# Patient Record
Sex: Female | Born: 1940 | Race: Black or African American | Hispanic: No | Marital: Single | Smoking: Never smoker
Health system: Southern US, Community
[De-identification: ages and names within clinical notes are randomized; demographics above are authoritative.]

## PROBLEM LIST (undated history)

## (undated) DIAGNOSIS — E559 Vitamin D deficiency, unspecified: Secondary | ICD-10-CM

## (undated) DIAGNOSIS — M810 Age-related osteoporosis without current pathological fracture: Secondary | ICD-10-CM

## (undated) DIAGNOSIS — E785 Hyperlipidemia, unspecified: Secondary | ICD-10-CM

## (undated) DIAGNOSIS — D352 Benign neoplasm of pituitary gland: Secondary | ICD-10-CM

## (undated) DIAGNOSIS — M1990 Unspecified osteoarthritis, unspecified site: Secondary | ICD-10-CM

## (undated) DIAGNOSIS — I10 Essential (primary) hypertension: Secondary | ICD-10-CM

## (undated) DIAGNOSIS — K219 Gastro-esophageal reflux disease without esophagitis: Secondary | ICD-10-CM

## (undated) DIAGNOSIS — F0390 Unspecified dementia without behavioral disturbance: Secondary | ICD-10-CM

## (undated) DIAGNOSIS — H9190 Unspecified hearing loss, unspecified ear: Secondary | ICD-10-CM

## (undated) DIAGNOSIS — R42 Dizziness and giddiness: Secondary | ICD-10-CM

## (undated) HISTORY — PX: EYE SURGERY: SHX253

## (undated) HISTORY — PX: ABDOMINAL HYSTERECTOMY: SHX81

---

## 2006-09-14 ENCOUNTER — Ambulatory Visit: Payer: Self-pay

## 2007-10-03 ENCOUNTER — Ambulatory Visit: Payer: Self-pay

## 2008-06-11 ENCOUNTER — Ambulatory Visit: Payer: Self-pay | Admitting: Otolaryngology

## 2008-11-27 ENCOUNTER — Ambulatory Visit: Payer: Self-pay | Admitting: Family Medicine

## 2010-01-05 ENCOUNTER — Ambulatory Visit: Payer: Self-pay | Admitting: Family Medicine

## 2010-09-10 ENCOUNTER — Ambulatory Visit: Payer: Self-pay | Admitting: Ophthalmology

## 2011-04-01 ENCOUNTER — Emergency Department: Payer: Self-pay | Admitting: Emergency Medicine

## 2011-08-18 ENCOUNTER — Ambulatory Visit: Payer: Self-pay

## 2011-11-29 ENCOUNTER — Ambulatory Visit: Payer: Self-pay | Admitting: Ophthalmology

## 2011-12-12 ENCOUNTER — Ambulatory Visit: Payer: Self-pay | Admitting: Ophthalmology

## 2013-02-05 ENCOUNTER — Ambulatory Visit: Payer: Self-pay | Admitting: Internal Medicine

## 2013-02-25 ENCOUNTER — Ambulatory Visit: Payer: Self-pay | Admitting: Internal Medicine

## 2014-03-27 ENCOUNTER — Ambulatory Visit: Payer: Self-pay | Admitting: Internal Medicine

## 2014-06-24 NOTE — Op Note (Signed)
PATIENT NAME:  Christy Green, Christy Green MR#:  [id number] DATE OF BIRTH:  30-Apr-1940  DATE OF PROCEDURE:  12/12/2011  PREOPERATIVE DIAGNOSIS:  Cataract, left eye.    POSTOPERATIVE DIAGNOSIS:  Cataract, left eye.  PROCEDURE PERFORMED:  Extracapsular cataract extraction using phacoemulsification with placement of an Alcon [id number], 19.0-diopter posterior chamber lens, serial [id number].  SURGEON:  Loura Back. Lanah Steines, MD  ASSISTANT:  None.  ANESTHESIA:  4% lidocaine and 0.75% Marcaine in a 50/50 mixture with 10 units/mL of Hylenex added, given as a peribulbar.  ANESTHESIOLOGIST:  Dennard Nip, MD  COMPLICATIONS:  None.  ESTIMATED BLOOD LOSS:  Less than 1 ml.  DESCRIPTION OF PROCEDURE:  The patient was brought to the operating room and given a peribulbar block.  The patient was then prepped and draped in the usual fashion.  The vertical rectus muscles were imbricated using 5-0 silk sutures.  These sutures were then clamped to the sterile drapes as bridle sutures.  A limbal peritomy was performed extending two clock hours and hemostasis was obtained with cautery.  A partial thickness scleral groove was made at the surgical limbus and dissected anteriorly in a lamellar dissection using an Alcon crescent knife.  The anterior chamber was entered supero-temporally with a Superblade and through the lamellar dissection with a 2.6 mm keratome.  DisCoVisc was used to replace the aqueous and a continuous tear capsulorrhexis was carried out.  Hydrodissection and hydrodelineation were carried out with balanced salt and a 27 gauge canula.  The nucleus was rotated to confirm the effectiveness of the hydrodissection.  Phacoemulsification was carried out using a divide-and-conquer technique.  Total ultrasound time was 2 minutes and 37 seconds with an average power of 25.5 percent with CDE of 67.67.  Irrigation/aspiration was used to remove the residual cortex.  DisCoVisc was used to inflate the capsule and the  internal incision was enlarged to 3 mm with the crescent knife.  The intraocular lens was folded and inserted into the capsular bag using the AcrySert delivery system. Irrigation/aspiration was used to remove the residual DisCoVisc.  Miostat was injected into the anterior chamber through the paracentesis track to inflate the anterior chamber and induce miosis.  The wound was checked for leaks and none were found. The conjunctiva was closed with cautery and the bridle sutures were removed.  Two drops of 0.3% Vigamox were placed on the eye.   An eye shield was placed on the eye.  The patient was discharged to the recovery room in good condition.  ____________________________ Loura Back Audie Stayer, MD sad:slb D: 12/12/2011 13:50:16 ET T: 12/12/2011 14:21:24 ET JOB#: [id number]  cc: Remo Lipps A. Gadiel John, MD, <Dictator> Martie Lee MD ELECTRONICALLY SIGNED 12/19/2011 14:11

## 2014-10-13 DIAGNOSIS — M1711 Unilateral primary osteoarthritis, right knee: Secondary | ICD-10-CM | POA: Diagnosis not present

## 2014-10-13 DIAGNOSIS — M25561 Pain in right knee: Secondary | ICD-10-CM | POA: Diagnosis not present

## 2014-11-25 DIAGNOSIS — K219 Gastro-esophageal reflux disease without esophagitis: Secondary | ICD-10-CM | POA: Diagnosis not present

## 2014-11-25 DIAGNOSIS — Z800 Family history of malignant neoplasm of digestive organs: Secondary | ICD-10-CM | POA: Diagnosis not present

## 2014-11-25 DIAGNOSIS — R1013 Epigastric pain: Secondary | ICD-10-CM | POA: Diagnosis not present

## 2014-12-03 DIAGNOSIS — Z961 Presence of intraocular lens: Secondary | ICD-10-CM | POA: Diagnosis not present

## 2014-12-10 DIAGNOSIS — M25461 Effusion, right knee: Secondary | ICD-10-CM | POA: Diagnosis not present

## 2014-12-10 DIAGNOSIS — M25561 Pain in right knee: Secondary | ICD-10-CM | POA: Diagnosis not present

## 2014-12-22 DIAGNOSIS — E782 Mixed hyperlipidemia: Secondary | ICD-10-CM | POA: Diagnosis not present

## 2014-12-22 DIAGNOSIS — K219 Gastro-esophageal reflux disease without esophagitis: Secondary | ICD-10-CM | POA: Diagnosis not present

## 2014-12-22 DIAGNOSIS — I10 Essential (primary) hypertension: Secondary | ICD-10-CM | POA: Diagnosis not present

## 2014-12-22 DIAGNOSIS — G4762 Sleep related leg cramps: Secondary | ICD-10-CM | POA: Diagnosis not present

## 2014-12-22 DIAGNOSIS — Z23 Encounter for immunization: Secondary | ICD-10-CM | POA: Diagnosis not present

## 2015-01-05 ENCOUNTER — Encounter: Payer: Self-pay | Admitting: *Deleted

## 2015-01-06 ENCOUNTER — Encounter: Admission: RE | Payer: Self-pay | Source: Ambulatory Visit

## 2015-01-06 ENCOUNTER — Ambulatory Visit
Admission: RE | Admit: 2015-01-06 | Payer: Commercial Managed Care - HMO | Source: Ambulatory Visit | Admitting: Gastroenterology

## 2015-01-06 HISTORY — DX: Vitamin D deficiency, unspecified: E55.9

## 2015-01-06 HISTORY — DX: Gastro-esophageal reflux disease without esophagitis: K21.9

## 2015-01-06 HISTORY — DX: Essential (primary) hypertension: I10

## 2015-01-06 HISTORY — DX: Hyperlipidemia, unspecified: E78.5

## 2015-01-06 HISTORY — DX: Age-related osteoporosis without current pathological fracture: M81.0

## 2015-01-06 SURGERY — COLONOSCOPY WITH PROPOFOL
Anesthesia: General

## 2015-02-23 ENCOUNTER — Encounter: Payer: Self-pay | Admitting: *Deleted

## 2015-02-24 ENCOUNTER — Ambulatory Visit: Payer: Commercial Managed Care - HMO | Admitting: Anesthesiology

## 2015-02-24 ENCOUNTER — Encounter: Payer: Self-pay | Admitting: *Deleted

## 2015-02-24 ENCOUNTER — Ambulatory Visit
Admission: RE | Admit: 2015-02-24 | Discharge: 2015-02-24 | Disposition: A | Discharge status: Home / Self Care | Payer: Commercial Managed Care - HMO | Source: Ambulatory Visit | Attending: Gastroenterology | Admitting: Gastroenterology

## 2015-02-24 ENCOUNTER — Encounter
Admission: RE | Disposition: A | Discharge status: Home / Self Care | Payer: Self-pay | Source: Ambulatory Visit | Attending: Gastroenterology

## 2015-02-24 DIAGNOSIS — Z1211 Encounter for screening for malignant neoplasm of colon: Secondary | ICD-10-CM | POA: Diagnosis not present

## 2015-02-24 DIAGNOSIS — Q438 Other specified congenital malformations of intestine: Secondary | ICD-10-CM | POA: Insufficient documentation

## 2015-02-24 DIAGNOSIS — K2960 Other gastritis without bleeding: Secondary | ICD-10-CM | POA: Diagnosis not present

## 2015-02-24 DIAGNOSIS — K644 Residual hemorrhoidal skin tags: Secondary | ICD-10-CM | POA: Diagnosis not present

## 2015-02-24 DIAGNOSIS — Z7982 Long term (current) use of aspirin: Secondary | ICD-10-CM | POA: Diagnosis not present

## 2015-02-24 DIAGNOSIS — K210 Gastro-esophageal reflux disease with esophagitis: Secondary | ICD-10-CM | POA: Diagnosis not present

## 2015-02-24 DIAGNOSIS — E559 Vitamin D deficiency, unspecified: Secondary | ICD-10-CM | POA: Diagnosis not present

## 2015-02-24 DIAGNOSIS — K5790 Diverticulosis of intestine, part unspecified, without perforation or abscess without bleeding: Secondary | ICD-10-CM | POA: Diagnosis not present

## 2015-02-24 DIAGNOSIS — K648 Other hemorrhoids: Secondary | ICD-10-CM | POA: Insufficient documentation

## 2015-02-24 DIAGNOSIS — K219 Gastro-esophageal reflux disease without esophagitis: Secondary | ICD-10-CM | POA: Diagnosis not present

## 2015-02-24 DIAGNOSIS — K222 Esophageal obstruction: Secondary | ICD-10-CM | POA: Diagnosis not present

## 2015-02-24 DIAGNOSIS — K2970 Gastritis, unspecified, without bleeding: Secondary | ICD-10-CM | POA: Diagnosis not present

## 2015-02-24 DIAGNOSIS — K640 First degree hemorrhoids: Secondary | ICD-10-CM | POA: Diagnosis not present

## 2015-02-24 DIAGNOSIS — I10 Essential (primary) hypertension: Secondary | ICD-10-CM | POA: Diagnosis not present

## 2015-02-24 DIAGNOSIS — M1990 Unspecified osteoarthritis, unspecified site: Secondary | ICD-10-CM | POA: Diagnosis not present

## 2015-02-24 DIAGNOSIS — K2950 Unspecified chronic gastritis without bleeding: Secondary | ICD-10-CM | POA: Diagnosis not present

## 2015-02-24 DIAGNOSIS — K2980 Duodenitis without bleeding: Secondary | ICD-10-CM | POA: Diagnosis not present

## 2015-02-24 DIAGNOSIS — Z79899 Other long term (current) drug therapy: Secondary | ICD-10-CM | POA: Insufficient documentation

## 2015-02-24 DIAGNOSIS — K449 Diaphragmatic hernia without obstruction or gangrene: Secondary | ICD-10-CM | POA: Insufficient documentation

## 2015-02-24 DIAGNOSIS — K5730 Diverticulosis of large intestine without perforation or abscess without bleeding: Secondary | ICD-10-CM | POA: Diagnosis not present

## 2015-02-24 DIAGNOSIS — Z538 Procedure and treatment not carried out for other reasons: Secondary | ICD-10-CM | POA: Diagnosis not present

## 2015-02-24 DIAGNOSIS — Z800 Family history of malignant neoplasm of digestive organs: Secondary | ICD-10-CM | POA: Diagnosis not present

## 2015-02-24 DIAGNOSIS — K2990 Gastroduodenitis, unspecified, without bleeding: Secondary | ICD-10-CM | POA: Diagnosis not present

## 2015-02-24 DIAGNOSIS — E785 Hyperlipidemia, unspecified: Secondary | ICD-10-CM | POA: Insufficient documentation

## 2015-02-24 HISTORY — PX: COLONOSCOPY WITH PROPOFOL: SHX5780

## 2015-02-24 HISTORY — DX: Unspecified osteoarthritis, unspecified site: M19.90

## 2015-02-24 HISTORY — PX: ESOPHAGOGASTRODUODENOSCOPY (EGD) WITH PROPOFOL: SHX5813

## 2015-02-24 SURGERY — ESOPHAGOGASTRODUODENOSCOPY (EGD) WITH PROPOFOL
Anesthesia: General

## 2015-02-24 MED ORDER — SODIUM CHLORIDE 0.9 % IV SOLN
INTRAVENOUS | Status: DC
  Administered 2015-02-24: 1000 mL via INTRAVENOUS

## 2015-02-24 MED ORDER — SODIUM CHLORIDE 0.9 % IV SOLN
INTRAVENOUS | Status: DC | PRN
  Administered 2015-02-24: 09:00:00 via INTRAVENOUS

## 2015-02-24 MED ORDER — PROPOFOL 500 MG/50ML IV EMUL
INTRAVENOUS | Status: DC | PRN
  Administered 2015-02-24: 120 ug/kg/min via INTRAVENOUS

## 2015-02-24 MED ORDER — SODIUM CHLORIDE 0.9 % IV SOLN: INTRAVENOUS | Status: DC

## 2015-02-24 MED ORDER — PROPOFOL 10 MG/ML IV BOLUS
INTRAVENOUS | Status: DC | PRN
  Administered 2015-02-24: 50 mg via INTRAVENOUS

## 2015-02-24 NOTE — Transfer of Care (Signed)
Immediate Anesthesia Transfer of Care Note  Patient: Christy Green  Procedure(s) Performed: Procedure(s): ESOPHAGOGASTRODUODENOSCOPY (EGD) WITH PROPOFOL (N/A) COLONOSCOPY WITH PROPOFOL (N/A)  Patient Location: PACU  Anesthesia Type:General  Level of Consciousness: awake, alert  and oriented  Airway & Oxygen Therapy: Patient Spontanous Breathing and Patient connected to nasal cannula oxygen  Post-op Assessment: Report given to RN and Post -op Vital signs reviewed and stable  Post vital signs: Reviewed and stable  Last Vitals:  Filed Vitals:   02/24/15 0822  BP: 158/62  Pulse: 71  Temp: 36.8 C  Resp: 16    Complications: No apparent anesthesia complications

## 2015-02-24 NOTE — Anesthesia Postprocedure Evaluation (Signed)
Anesthesia Post Note  Patient: Christy Green  Procedure(s) Performed: Procedure(s) (LRB): ESOPHAGOGASTRODUODENOSCOPY (EGD) WITH PROPOFOL (N/A) COLONOSCOPY WITH PROPOFOL (N/A)  Patient location during evaluation: Endoscopy Anesthesia Type: General Level of consciousness: awake Pain management: pain level controlled Vital Signs Assessment: post-procedure vital signs reviewed and stable Respiratory status: spontaneous breathing Cardiovascular status: blood pressure returned to baseline Anesthetic complications: no    Last Vitals:  Filed Vitals:   02/24/15 1020 02/24/15 1030  BP: 123/66 123/65  Pulse:    Temp:    Resp:      Last Pain: There were no vitals filed for this visit.               Tabithia Stroder S

## 2015-02-24 NOTE — H&P (Signed)
Outpatient short stay form Pre-procedure 02/24/2015 8:46 AM Lollie Sails MD  Primary Physician: Glendon Axe, MD  Reason for visit:  EGD and colonoscopy  History of present illness:  Patient is a 74 year old female presenting today for a colonoscopy in regards to her family history of colon cancer and a primary relative as well as some problems with increasing reflux symptoms. She has been placed on a proton pump inhibitor however this has not ameliorated the problem. In discussion with her she is not taking that the correct intervals and this may be of some benefit to adjust.  He tolerated her prep well. She does take an 81 mg aspirin but hasn't for maybe 2 days. She also takes etodolac 400 mg twice a day.    Current facility-administered medications:  .  0.9 %  sodium chloride infusion, , Intravenous, Continuous, Lollie Sails, MD, Last Rate: 20 mL/hr at 02/24/15 0844, 1,000 mL at 02/24/15 0844 .  0.9 %  sodium chloride infusion, , Intravenous, Continuous, Lollie Sails, MD  Prescriptions prior to admission  Medication Sig Dispense Refill Last Dose  . amLODipine (NORVASC) 5 MG tablet Take 5 mg by mouth daily.     Marland Kitchen aspirin EC 81 MG tablet Take 81 mg by mouth daily.     Marland Kitchen etodolac (LODINE) 400 MG tablet Take 400 mg by mouth 2 (two) times daily.     . Multiple Vitamin (MULTIVITAMIN IRON-FREE PO) Take by mouth.     . Omega 3-6-9 Fatty Acids (OMEGA-3 & OMEGA-6 FISH OIL PO) Take by mouth.     . pantoprazole (PROTONIX) 40 MG tablet Take 40 mg by mouth daily.     . pediatric multivitamin + iron (POLY-VI-SOL +IRON) 10 MG/ML oral solution Take by mouth daily.     . pravastatin (PRAVACHOL) 10 MG tablet Take 10 mg by mouth daily.     . prednisoLONE acetate (PRED FORTE) 1 % ophthalmic suspension 1 drop 4 (four) times daily.     . sucralfate (CARAFATE) 1 G tablet Take 1 g by mouth 4 (four) times daily -  with meals and at bedtime.        No Known Allergies   Past Medical  History  Diagnosis Date  . GERD (gastroesophageal reflux disease)   . Hyperlipidemia   . Hypertension   . Osteoporosis   . Vitamin D deficiency   . Arthritis     Review of systems:      Physical Exam    Heart and lungs: Regular rate and rhythm without rub or gallop, lungs are bilaterally clear.    HEENT: Normocephalic atraumatic eyes are anicteric    Other:     Pertinant exam for procedure: Soft nontender nondistended bowel sounds positive normoactive.    Planned proceedures: Egd, colonoscopy and indicated procedures. I have discussed the risks benefits and complications of procedures to include not limited to bleeding, infection, perforation and the risk of sedation and the patient wishes to proceed.    Lollie Sails, MD Gastroenterology 02/24/2015  8:46 AM

## 2015-02-24 NOTE — Op Note (Signed)
[facility] Gastroenterology Patient Name: Christy Green Procedure Date: 02/24/2015 8:51 AM MRN: [id number] Account #: [id number] Date of Birth: [birth year] Admit Type: Outpatient Age: 74 Room: [facility] ENDO ROOM 3 Gender: Female Note Status: Finalized Procedure:         Upper GI endoscopy Providers:         Lollie Sails, MD Referring MD:      Glendon Axe (Referring MD) Medicines:         Monitored Anesthesia Care Complications:     No immediate complications. Procedure:         Pre-Anesthesia Assessment:                    - ASA Grade Assessment: III - A patient with severe                     systemic disease.                    After obtaining informed consent, the endoscope was passed                     under direct vision. Throughout the procedure, the                     patient's blood pressure, pulse, and oxygen saturations                     were monitored continuously. The Endoscope was introduced                     through the mouth, and advanced to the third part of                     duodenum. The upper GI endoscopy was accomplished without                     difficulty. The patient tolerated the procedure well. Findings:      A small hiatus hernia was found. The Z-line was a variable distance from       incisors; the hiatal hernia was sliding.      A widely patent and non-obstructing Schatzki ring (acquired) was found       at the gastroesophageal junction. Biopsies were taken with a cold       forceps for histology.      Patchy minimal inflammation characterized by congestion (edema) and       erythema was found in the prepyloric region of the stomach. Biopsies       were taken with a cold forceps for histology.      Patchy mild inflammation characterized by congestion (edema) and       erythema was found in the duodenal bulb.      The cardia and gastric fundus were normal on retroflexion. Impression:        - Small hiatus hernia.                  - Widely patent and non-obstructing Schatzki ring.                     Biopsied.                    - Gastritis. Biopsied.                    -  Duodenitis. Recommendation:    - Return to GI office in 1 month.                    - Use Protonix (pantoprazole) 40 mg PO daily daily.                    - Await pathology results.                    - Use Protonix (pantoprazole) 40 mg PO BID for 4 weeks. Procedure Code(s): --- Professional ---                    [phone], Esophagogastroduodenoscopy, flexible, transoral;                     with biopsy, single or multiple Diagnosis Code(s): --- Professional ---                    K44.9, Diaphragmatic hernia without obstruction or gangrene                    K22.2, Esophageal obstruction                    K29.70, Gastritis, unspecified, without bleeding                    K29.80, Duodenitis without bleeding CPT copyright 2014 American Medical Association. All rights reserved. The codes documented in this report are preliminary and upon coder review may  be revised to meet current compliance requirements. Lollie Sails, MD 02/24/2015 9:12:34 AM This report has been signed electronically. Number of Addenda: 0 Note Initiated On: 02/24/2015 8:51 AM      [facility]

## 2015-02-24 NOTE — Anesthesia Preprocedure Evaluation (Signed)
Anesthesia Evaluation  Patient identified by MRN, date of birth, ID band Patient awake    Reviewed: Allergy & Precautions, NPO status , Patient's Chart, lab work & pertinent test results, reviewed documented beta blocker date and time   Airway Mallampati: II  TM Distance: >3 FB     Dental  (+) Chipped   Pulmonary           Cardiovascular hypertension, Pt. on medications      Neuro/Psych    GI/Hepatic GERD  ,  Endo/Other    Renal/GU      Musculoskeletal  (+) Arthritis ,   Abdominal   Peds  Hematology   Anesthesia Other Findings   Reproductive/Obstetrics                             Anesthesia Physical Anesthesia Plan  ASA: III  Anesthesia Plan: General   Post-op Pain Management:    Induction: Intravenous  Airway Management Planned: Nasal Cannula  Additional Equipment:   Intra-op Plan:   Post-operative Plan:   Informed Consent: I have reviewed the patients History and Physical, chart, labs and discussed the procedure including the risks, benefits and alternatives for the proposed anesthesia with the patient or authorized representative who has indicated his/her understanding and acceptance.     Plan Discussed with: CRNA  Anesthesia Plan Comments:         Anesthesia Quick Evaluation  

## 2015-02-24 NOTE — Op Note (Signed)
[facility] Gastroenterology Patient Name: Christy Green Procedure Date: 02/24/2015 8:51 AM MRN: [id number] Account #: [id number] Date of Birth: Sep 10, 1940 Admit Type: Outpatient Age: 74 Room: [facility] ENDO ROOM 3 Gender: Female Note Status: Finalized Procedure:         Colonoscopy Indications:       Family history of colon cancer in a first-degree relative Providers:         Lollie Sails, MD Referring MD:      Glendon Axe (Referring MD) Medicines:         Monitored Anesthesia Care Complications:     No immediate complications. Procedure:         Pre-Anesthesia Assessment:                    - ASA Grade Assessment: III - A patient with severe                     systemic disease.                    After obtaining informed consent, the colonoscope was                     passed under direct vision. Throughout the procedure, the                     patient's blood pressure, pulse, and oxygen saturations                     were monitored continuously. The Colonoscope was                     introduced through the anus with the intention of                     advancing to the cecum. The scope was advanced to the                     transverse colon before the procedure was aborted.                     Medications were given. The colonoscopy was extremely                     difficult due to restricted mobility of the colon,                     significant looping and a tortuous colon. The patient                     tolerated the procedure well. The quality of the bowel                     preparation was good. Findings:      Many small and large-mouthed diverticula were found in the sigmoid       colon, in the descending colon and in the transverse colon.      The sigmoid colon and descending colon were significantly redundant.      Non-bleeding internal hemorrhoids were found during retroflexion and       during anoscopy. The hemorrhoids were small.    No additional abnormalities were found on retroflexion.      The perianal exam findings include skin tags. Impression:        - Diverticulosis  in the sigmoid colon, in the descending                     colon and in the transverse colon.                    - Redundant colon.                    - Non-bleeding internal hemorrhoids.                    - Perianal skin tags found on perianal exam.                    - No specimens collected. Recommendation:    - Discharge patient to home.                    - Perform an air contrast barium enema at appointment to                     be scheduled. Procedure Code(s): --- Professional ---                    [phone], 51, Colonoscopy, flexible; diagnostic, including                     collection of specimen(s) by brushing or washing, when                     performed (separate procedure) Diagnosis Code(s): --- Professional ---                    K64.8, Other hemorrhoids                    K64.4, Residual hemorrhoidal skin tags                    Z80.0, Family history of malignant neoplasm of digestive                     organs                    K57.30, Diverticulosis of large intestine without                     perforation or abscess without bleeding                    Q43.8, Other specified congenital malformations of                     intestine CPT copyright 2014 American Medical Association. All rights reserved. The codes documented in this report are preliminary and upon coder review may  be revised to meet current compliance requirements. Lollie Sails, MD 02/24/2015 9:54:21 AM This report has been signed electronically. Number of Addenda: 0 Note Initiated On: 02/24/2015 8:51 AM Scope Withdrawal Time: 0 hours 2 minutes 55 seconds  Total Procedure Duration: 0 hours 34 minutes 45 seconds       [facility]

## 2015-02-26 ENCOUNTER — Encounter: Payer: Self-pay | Admitting: Gastroenterology

## 2015-02-26 LAB — SURGICAL PATHOLOGY

## 2015-03-11 DIAGNOSIS — I10 Essential (primary) hypertension: Secondary | ICD-10-CM | POA: Diagnosis not present

## 2015-03-17 DIAGNOSIS — Z1239 Encounter for other screening for malignant neoplasm of breast: Secondary | ICD-10-CM | POA: Diagnosis not present

## 2015-03-17 DIAGNOSIS — E7800 Pure hypercholesterolemia, unspecified: Secondary | ICD-10-CM | POA: Diagnosis not present

## 2015-03-17 DIAGNOSIS — M1711 Unilateral primary osteoarthritis, right knee: Secondary | ICD-10-CM | POA: Diagnosis not present

## 2015-03-17 DIAGNOSIS — K219 Gastro-esophageal reflux disease without esophagitis: Secondary | ICD-10-CM | POA: Diagnosis not present

## 2015-03-17 DIAGNOSIS — I10 Essential (primary) hypertension: Secondary | ICD-10-CM | POA: Diagnosis not present

## 2015-03-18 ENCOUNTER — Other Ambulatory Visit: Payer: Self-pay | Admitting: Internal Medicine

## 2015-03-18 DIAGNOSIS — Z1239 Encounter for other screening for malignant neoplasm of breast: Secondary | ICD-10-CM

## 2015-03-24 DIAGNOSIS — R490 Dysphonia: Secondary | ICD-10-CM | POA: Diagnosis not present

## 2015-03-24 DIAGNOSIS — Z800 Family history of malignant neoplasm of digestive organs: Secondary | ICD-10-CM | POA: Diagnosis not present

## 2015-03-24 DIAGNOSIS — K219 Gastro-esophageal reflux disease without esophagitis: Secondary | ICD-10-CM | POA: Diagnosis not present

## 2015-03-25 ENCOUNTER — Other Ambulatory Visit: Payer: Self-pay | Admitting: Gastroenterology

## 2015-03-25 DIAGNOSIS — Z800 Family history of malignant neoplasm of digestive organs: Secondary | ICD-10-CM

## 2015-03-30 ENCOUNTER — Ambulatory Visit
Admission: RE | Admit: 2015-03-30 | Discharge: 2015-03-30 | Disposition: A | Discharge status: Home / Self Care | Payer: Commercial Managed Care - HMO | Source: Ambulatory Visit | Attending: Internal Medicine | Admitting: Internal Medicine

## 2015-03-30 ENCOUNTER — Other Ambulatory Visit: Payer: Self-pay | Admitting: Internal Medicine

## 2015-03-30 DIAGNOSIS — Z1231 Encounter for screening mammogram for malignant neoplasm of breast: Secondary | ICD-10-CM | POA: Insufficient documentation

## 2015-03-30 DIAGNOSIS — Z1239 Encounter for other screening for malignant neoplasm of breast: Secondary | ICD-10-CM

## 2015-04-02 DIAGNOSIS — M1711 Unilateral primary osteoarthritis, right knee: Secondary | ICD-10-CM | POA: Diagnosis not present

## 2015-04-03 ENCOUNTER — Ambulatory Visit: Payer: Commercial Managed Care - HMO

## 2015-04-23 ENCOUNTER — Other Ambulatory Visit: Payer: Self-pay | Admitting: Gastroenterology

## 2015-04-23 DIAGNOSIS — Q438 Other specified congenital malformations of intestine: Secondary | ICD-10-CM

## 2015-04-23 DIAGNOSIS — Z800 Family history of malignant neoplasm of digestive organs: Secondary | ICD-10-CM

## 2015-05-08 ENCOUNTER — Ambulatory Visit
Admission: RE | Admit: 2015-05-08 | Discharge: 2015-05-08 | Disposition: A | Discharge status: Home / Self Care | Payer: Commercial Managed Care - HMO | Source: Ambulatory Visit | Attending: Gastroenterology | Admitting: Gastroenterology

## 2015-05-08 DIAGNOSIS — Z800 Family history of malignant neoplasm of digestive organs: Secondary | ICD-10-CM | POA: Diagnosis not present

## 2015-05-08 DIAGNOSIS — K5790 Diverticulosis of intestine, part unspecified, without perforation or abscess without bleeding: Secondary | ICD-10-CM | POA: Diagnosis not present

## 2015-05-08 DIAGNOSIS — K5730 Diverticulosis of large intestine without perforation or abscess without bleeding: Secondary | ICD-10-CM | POA: Diagnosis not present

## 2015-05-08 DIAGNOSIS — Q438 Other specified congenital malformations of intestine: Secondary | ICD-10-CM

## 2015-06-11 DIAGNOSIS — H2012 Chronic iridocyclitis, left eye: Secondary | ICD-10-CM | POA: Diagnosis not present

## 2015-06-16 DIAGNOSIS — E7800 Pure hypercholesterolemia, unspecified: Secondary | ICD-10-CM | POA: Diagnosis not present

## 2015-06-16 DIAGNOSIS — K219 Gastro-esophageal reflux disease without esophagitis: Secondary | ICD-10-CM | POA: Diagnosis not present

## 2015-06-16 DIAGNOSIS — I10 Essential (primary) hypertension: Secondary | ICD-10-CM | POA: Diagnosis not present

## 2015-06-16 DIAGNOSIS — E559 Vitamin D deficiency, unspecified: Secondary | ICD-10-CM | POA: Diagnosis not present

## 2015-07-13 DIAGNOSIS — M1711 Unilateral primary osteoarthritis, right knee: Secondary | ICD-10-CM | POA: Diagnosis not present

## 2015-08-24 DIAGNOSIS — J301 Allergic rhinitis due to pollen: Secondary | ICD-10-CM | POA: Diagnosis not present

## 2015-08-24 DIAGNOSIS — R490 Dysphonia: Secondary | ICD-10-CM | POA: Diagnosis not present

## 2015-08-24 DIAGNOSIS — K219 Gastro-esophageal reflux disease without esophagitis: Secondary | ICD-10-CM | POA: Diagnosis not present

## 2015-08-25 DIAGNOSIS — M7041 Prepatellar bursitis, right knee: Secondary | ICD-10-CM | POA: Diagnosis not present

## 2015-08-25 DIAGNOSIS — M25561 Pain in right knee: Secondary | ICD-10-CM | POA: Diagnosis not present

## 2015-08-25 DIAGNOSIS — Z111 Encounter for screening for respiratory tuberculosis: Secondary | ICD-10-CM | POA: Diagnosis not present

## 2015-09-09 DIAGNOSIS — M7041 Prepatellar bursitis, right knee: Secondary | ICD-10-CM | POA: Diagnosis not present

## 2015-09-16 DIAGNOSIS — I10 Essential (primary) hypertension: Secondary | ICD-10-CM | POA: Diagnosis not present

## 2015-09-16 DIAGNOSIS — E559 Vitamin D deficiency, unspecified: Secondary | ICD-10-CM | POA: Diagnosis not present

## 2015-09-16 DIAGNOSIS — E7800 Pure hypercholesterolemia, unspecified: Secondary | ICD-10-CM | POA: Diagnosis not present

## 2015-10-21 DIAGNOSIS — E7800 Pure hypercholesterolemia, unspecified: Secondary | ICD-10-CM | POA: Diagnosis not present

## 2015-10-21 DIAGNOSIS — G301 Alzheimer's disease with late onset: Secondary | ICD-10-CM | POA: Diagnosis not present

## 2015-10-21 DIAGNOSIS — I10 Essential (primary) hypertension: Secondary | ICD-10-CM | POA: Diagnosis not present

## 2015-10-21 DIAGNOSIS — E559 Vitamin D deficiency, unspecified: Secondary | ICD-10-CM | POA: Diagnosis not present

## 2015-10-21 DIAGNOSIS — F0280 Dementia in other diseases classified elsewhere without behavioral disturbance: Secondary | ICD-10-CM | POA: Diagnosis not present

## 2015-10-21 DIAGNOSIS — H918X3 Other specified hearing loss, bilateral: Secondary | ICD-10-CM | POA: Diagnosis not present

## 2015-11-17 DIAGNOSIS — H6123 Impacted cerumen, bilateral: Secondary | ICD-10-CM | POA: Diagnosis not present

## 2015-11-17 DIAGNOSIS — H903 Sensorineural hearing loss, bilateral: Secondary | ICD-10-CM | POA: Diagnosis not present

## 2015-12-08 DIAGNOSIS — H2511 Age-related nuclear cataract, right eye: Secondary | ICD-10-CM | POA: Diagnosis not present

## 2016-03-22 DIAGNOSIS — K219 Gastro-esophageal reflux disease without esophagitis: Secondary | ICD-10-CM | POA: Diagnosis not present

## 2016-03-28 ENCOUNTER — Other Ambulatory Visit: Payer: Self-pay | Admitting: Internal Medicine

## 2016-03-28 DIAGNOSIS — Z1231 Encounter for screening mammogram for malignant neoplasm of breast: Secondary | ICD-10-CM

## 2016-03-31 ENCOUNTER — Other Ambulatory Visit: Payer: Self-pay | Admitting: Gastroenterology

## 2016-03-31 DIAGNOSIS — Z1211 Encounter for screening for malignant neoplasm of colon: Secondary | ICD-10-CM

## 2016-03-31 DIAGNOSIS — Z800 Family history of malignant neoplasm of digestive organs: Secondary | ICD-10-CM

## 2016-04-12 ENCOUNTER — Ambulatory Visit
Admission: RE | Admit: 2016-04-12 | Discharge: 2016-04-12 | Disposition: A | Discharge status: Home / Self Care | Payer: Medicare HMO | Source: Ambulatory Visit | Attending: Gastroenterology | Admitting: Gastroenterology

## 2016-04-12 DIAGNOSIS — K5730 Diverticulosis of large intestine without perforation or abscess without bleeding: Secondary | ICD-10-CM | POA: Diagnosis not present

## 2016-04-12 DIAGNOSIS — K8020 Calculus of gallbladder without cholecystitis without obstruction: Secondary | ICD-10-CM | POA: Diagnosis not present

## 2016-04-12 DIAGNOSIS — Z1211 Encounter for screening for malignant neoplasm of colon: Secondary | ICD-10-CM

## 2016-04-12 DIAGNOSIS — Z800 Family history of malignant neoplasm of digestive organs: Secondary | ICD-10-CM

## 2016-04-20 ENCOUNTER — Ambulatory Visit
Admission: RE | Admit: 2016-04-20 | Discharge: 2016-04-20 | Disposition: A | Discharge status: Home / Self Care | Payer: Commercial Managed Care - HMO | Source: Ambulatory Visit | Attending: Internal Medicine | Admitting: Internal Medicine

## 2016-04-20 DIAGNOSIS — Z1231 Encounter for screening mammogram for malignant neoplasm of breast: Secondary | ICD-10-CM | POA: Diagnosis not present

## 2016-06-06 DIAGNOSIS — H2511 Age-related nuclear cataract, right eye: Secondary | ICD-10-CM | POA: Diagnosis not present

## 2016-06-11 IMAGING — CR DG BE W/ AIR HIGH DENSITY
2 series · 10 of 10 positions shown · non-contrast
Comparison: None.

CLINICAL DATA: Incomplete colonoscopy.

EXAM:
AIR CONTRAST BARIUM ENEMA
TECHNIQUE: Initial scout AP supine abdominal image obtained to insure adequate
colon cleansing. Barium was introduced into the colon in a
retrograde fashion and refluxed from the rectum to the distal
transverse colon. As much of the barium as possible was then removed
through the indwelling tube via gravity drain. Air was then
insufflated into the colon. Spot images of the colon followed by
overhead radiographs were obtained.
FLUOROSCOPY TIME:  Radiation Exposure Index (as provided by the
fluoroscopic device): 109.4 mGy.

[Series 1: x rectum lateral · 0.14mm/px · 5 of 11 slices shown]
[im 1/11]
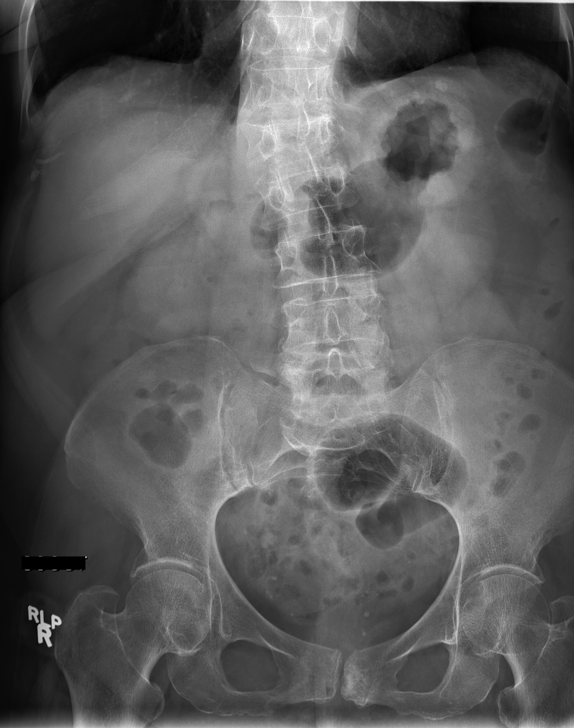
[im 3/11]
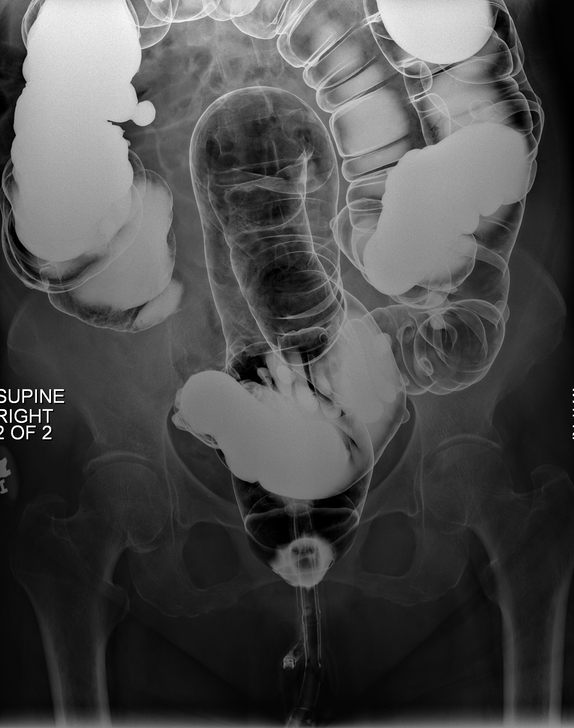
[im 6/11]
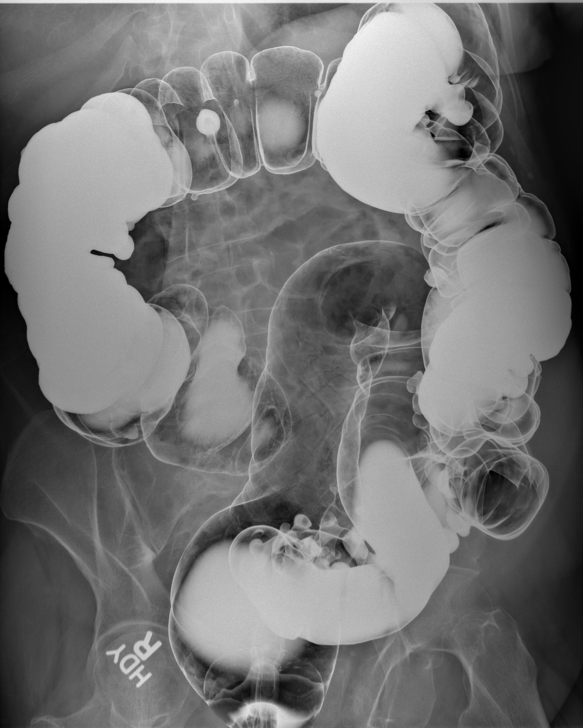
[im 8/11]
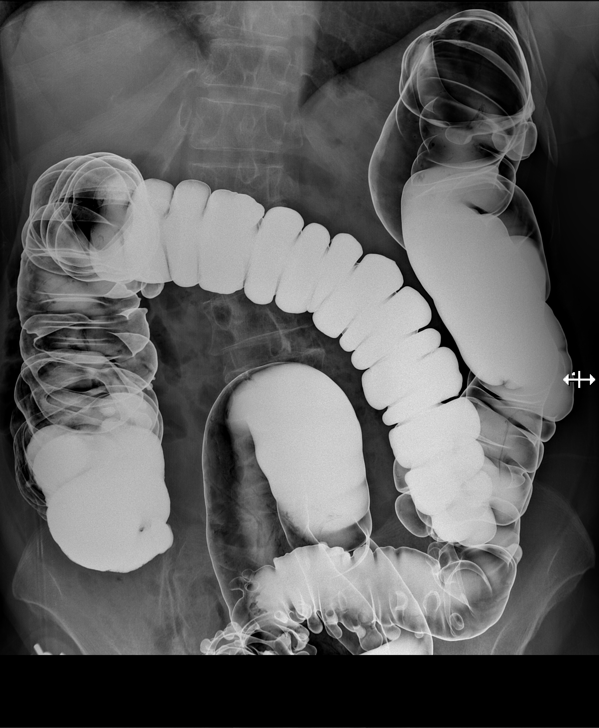
[im 11/11]
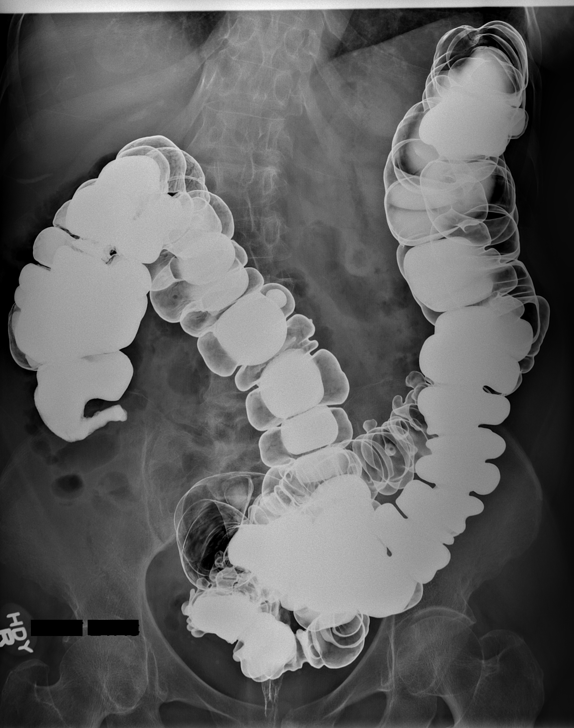

[Series 2: fluoro_barium singleshot_bb · 0.18mm/px · 5 of 10 slices shown]
[im 1/10]
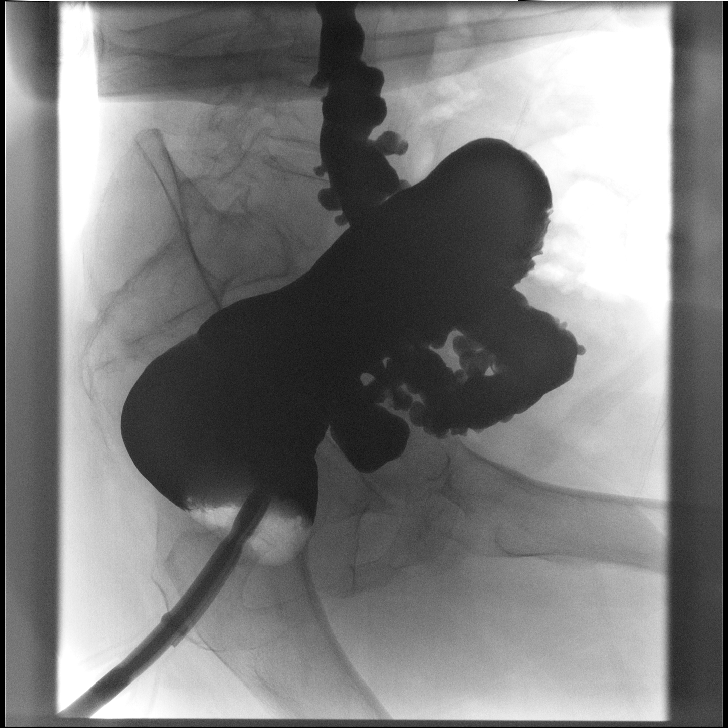
[im 3/10]
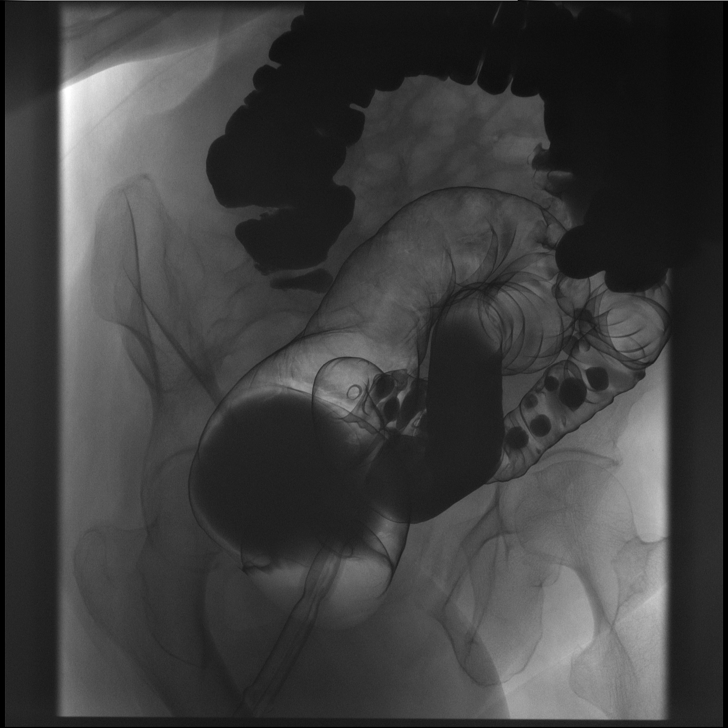
[im 5/10]
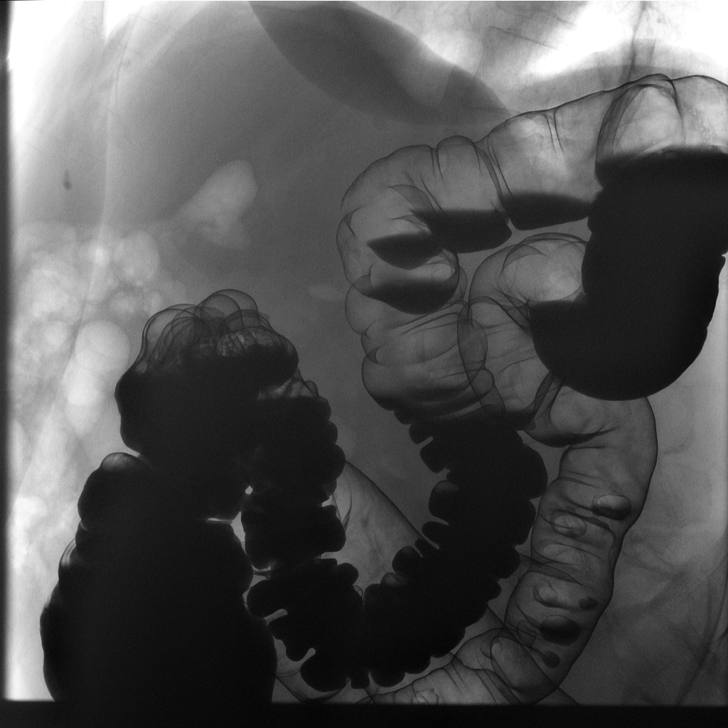
[im 7/10]
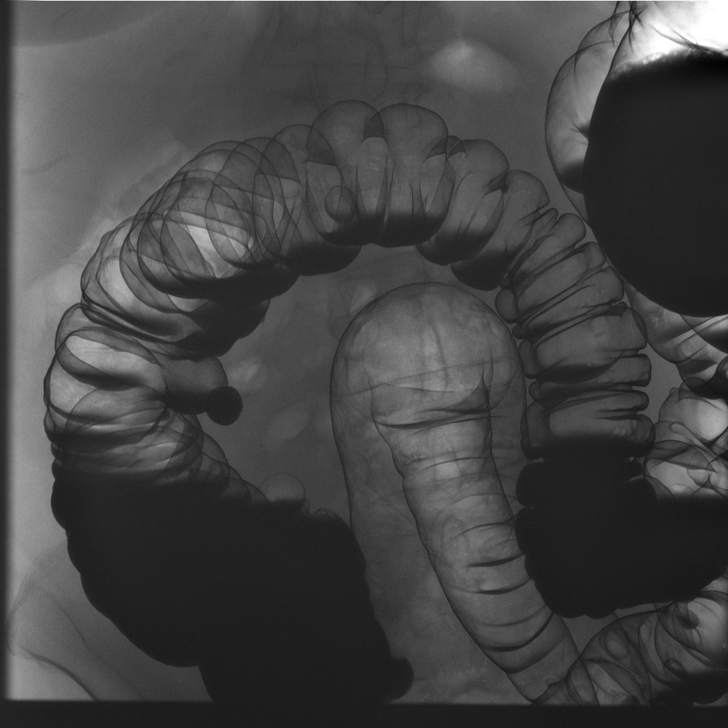
[im 10/10]
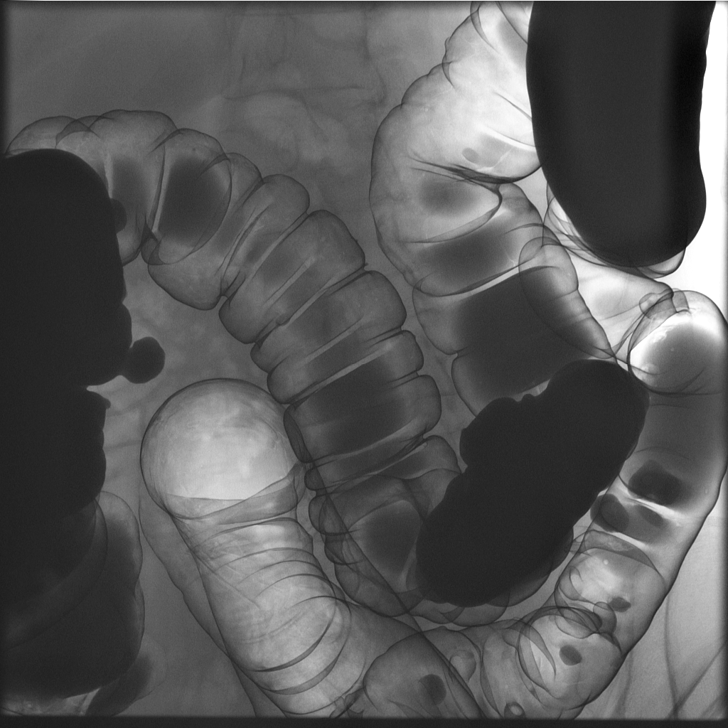

[10 of 10 positions shown; findings below may reference images not displayed]

FINDINGS: Scattered diverticuli noted throughout the entire colon. No focal
mass lesion or obstructing lesion identified. The terminal ileum
could not be visualized. What appears to be the appendix is normal.
IMPRESSION: Diffuse diverticulosis.  No focal lesion identified.

## 2016-07-21 DIAGNOSIS — E7800 Pure hypercholesterolemia, unspecified: Secondary | ICD-10-CM | POA: Diagnosis not present

## 2016-07-21 DIAGNOSIS — R42 Dizziness and giddiness: Secondary | ICD-10-CM | POA: Diagnosis not present

## 2016-07-21 DIAGNOSIS — Z131 Encounter for screening for diabetes mellitus: Secondary | ICD-10-CM | POA: Diagnosis not present

## 2016-07-21 DIAGNOSIS — I10 Essential (primary) hypertension: Secondary | ICD-10-CM | POA: Diagnosis not present

## 2016-07-21 DIAGNOSIS — E559 Vitamin D deficiency, unspecified: Secondary | ICD-10-CM | POA: Diagnosis not present

## 2016-07-21 DIAGNOSIS — F0280 Dementia in other diseases classified elsewhere without behavioral disturbance: Secondary | ICD-10-CM | POA: Diagnosis not present

## 2016-07-21 DIAGNOSIS — G301 Alzheimer's disease with late onset: Secondary | ICD-10-CM | POA: Diagnosis not present

## 2016-08-18 DIAGNOSIS — I6523 Occlusion and stenosis of bilateral carotid arteries: Secondary | ICD-10-CM | POA: Diagnosis not present

## 2016-08-18 DIAGNOSIS — R42 Dizziness and giddiness: Secondary | ICD-10-CM | POA: Diagnosis not present

## 2016-09-30 DIAGNOSIS — F0280 Dementia in other diseases classified elsewhere without behavioral disturbance: Secondary | ICD-10-CM | POA: Diagnosis not present

## 2016-09-30 DIAGNOSIS — Z23 Encounter for immunization: Secondary | ICD-10-CM | POA: Diagnosis not present

## 2016-09-30 DIAGNOSIS — E7800 Pure hypercholesterolemia, unspecified: Secondary | ICD-10-CM | POA: Diagnosis not present

## 2016-09-30 DIAGNOSIS — I10 Essential (primary) hypertension: Secondary | ICD-10-CM | POA: Diagnosis not present

## 2016-09-30 DIAGNOSIS — G301 Alzheimer's disease with late onset: Secondary | ICD-10-CM | POA: Diagnosis not present

## 2016-12-05 DIAGNOSIS — H2012 Chronic iridocyclitis, left eye: Secondary | ICD-10-CM | POA: Diagnosis not present

## 2017-03-15 DIAGNOSIS — E7800 Pure hypercholesterolemia, unspecified: Secondary | ICD-10-CM | POA: Diagnosis not present

## 2017-03-15 DIAGNOSIS — G301 Alzheimer's disease with late onset: Secondary | ICD-10-CM | POA: Diagnosis not present

## 2017-03-15 DIAGNOSIS — Z1231 Encounter for screening mammogram for malignant neoplasm of breast: Secondary | ICD-10-CM | POA: Diagnosis not present

## 2017-03-15 DIAGNOSIS — M7581 Other shoulder lesions, right shoulder: Secondary | ICD-10-CM | POA: Diagnosis not present

## 2017-03-15 DIAGNOSIS — F0280 Dementia in other diseases classified elsewhere without behavioral disturbance: Secondary | ICD-10-CM | POA: Diagnosis not present

## 2017-03-15 DIAGNOSIS — I10 Essential (primary) hypertension: Secondary | ICD-10-CM | POA: Diagnosis not present

## 2017-03-16 ENCOUNTER — Other Ambulatory Visit: Payer: Self-pay | Admitting: Internal Medicine

## 2017-03-16 DIAGNOSIS — Z1239 Encounter for other screening for malignant neoplasm of breast: Secondary | ICD-10-CM

## 2017-03-24 DIAGNOSIS — K5900 Constipation, unspecified: Secondary | ICD-10-CM | POA: Diagnosis not present

## 2017-03-24 DIAGNOSIS — R140 Abdominal distension (gaseous): Secondary | ICD-10-CM | POA: Diagnosis not present

## 2017-03-24 DIAGNOSIS — R1084 Generalized abdominal pain: Secondary | ICD-10-CM | POA: Diagnosis not present

## 2017-05-01 ENCOUNTER — Ambulatory Visit
Admission: RE | Admit: 2017-05-01 | Discharge: 2017-05-01 | Disposition: A | Discharge status: Home / Self Care | Payer: Medicare HMO | Source: Ambulatory Visit | Attending: Internal Medicine | Admitting: Internal Medicine

## 2017-05-01 DIAGNOSIS — Z1231 Encounter for screening mammogram for malignant neoplasm of breast: Secondary | ICD-10-CM | POA: Diagnosis not present

## 2017-05-01 DIAGNOSIS — Z1239 Encounter for other screening for malignant neoplasm of breast: Secondary | ICD-10-CM

## 2017-06-02 DIAGNOSIS — I10 Essential (primary) hypertension: Secondary | ICD-10-CM | POA: Diagnosis not present

## 2017-06-02 DIAGNOSIS — K219 Gastro-esophageal reflux disease without esophagitis: Secondary | ICD-10-CM | POA: Diagnosis not present

## 2017-06-02 DIAGNOSIS — K5909 Other constipation: Secondary | ICD-10-CM | POA: Diagnosis not present

## 2017-06-02 DIAGNOSIS — E7800 Pure hypercholesterolemia, unspecified: Secondary | ICD-10-CM | POA: Diagnosis not present

## 2017-06-05 DIAGNOSIS — Z961 Presence of intraocular lens: Secondary | ICD-10-CM | POA: Diagnosis not present

## 2017-06-29 DIAGNOSIS — R1310 Dysphagia, unspecified: Secondary | ICD-10-CM | POA: Diagnosis not present

## 2017-06-29 DIAGNOSIS — K219 Gastro-esophageal reflux disease without esophagitis: Secondary | ICD-10-CM | POA: Diagnosis not present

## 2017-07-26 DIAGNOSIS — I10 Essential (primary) hypertension: Secondary | ICD-10-CM | POA: Diagnosis not present

## 2017-07-26 DIAGNOSIS — K5909 Other constipation: Secondary | ICD-10-CM | POA: Diagnosis not present

## 2017-07-26 DIAGNOSIS — K219 Gastro-esophageal reflux disease without esophagitis: Secondary | ICD-10-CM | POA: Diagnosis not present

## 2017-08-22 DIAGNOSIS — K219 Gastro-esophageal reflux disease without esophagitis: Secondary | ICD-10-CM | POA: Diagnosis not present

## 2017-08-22 DIAGNOSIS — G301 Alzheimer's disease with late onset: Secondary | ICD-10-CM | POA: Diagnosis not present

## 2017-08-22 DIAGNOSIS — F0280 Dementia in other diseases classified elsewhere without behavioral disturbance: Secondary | ICD-10-CM | POA: Diagnosis not present

## 2017-09-13 DIAGNOSIS — I10 Essential (primary) hypertension: Secondary | ICD-10-CM | POA: Diagnosis not present

## 2017-09-13 DIAGNOSIS — R42 Dizziness and giddiness: Secondary | ICD-10-CM | POA: Diagnosis not present

## 2017-09-17 ENCOUNTER — Emergency Department: Payer: Medicare HMO

## 2017-09-17 ENCOUNTER — Other Ambulatory Visit: Payer: Self-pay

## 2017-09-17 ENCOUNTER — Emergency Department
Admission: EM | Admit: 2017-09-17 | Discharge: 2017-09-17 | Disposition: A | Discharge status: Home / Self Care | Payer: Medicare HMO | Attending: Emergency Medicine | Admitting: Emergency Medicine

## 2017-09-17 DIAGNOSIS — I10 Essential (primary) hypertension: Secondary | ICD-10-CM | POA: Insufficient documentation

## 2017-09-17 DIAGNOSIS — D352 Benign neoplasm of pituitary gland: Secondary | ICD-10-CM | POA: Insufficient documentation

## 2017-09-17 DIAGNOSIS — Z79899 Other long term (current) drug therapy: Secondary | ICD-10-CM | POA: Diagnosis not present

## 2017-09-17 DIAGNOSIS — Z7982 Long term (current) use of aspirin: Secondary | ICD-10-CM | POA: Diagnosis not present

## 2017-09-17 DIAGNOSIS — E860 Dehydration: Secondary | ICD-10-CM | POA: Diagnosis not present

## 2017-09-17 DIAGNOSIS — R42 Dizziness and giddiness: Secondary | ICD-10-CM | POA: Diagnosis not present

## 2017-09-17 LAB — CBC
HCT: 34.9 % — ABNORMAL LOW (ref 35.0–47.0)
Hemoglobin: 12.3 g/dL (ref 12.0–16.0)
MCH: 32.4 pg (ref 26.0–34.0)
MCHC: 35.4 g/dL (ref 32.0–36.0)
MCV: 91.4 fL (ref 80.0–100.0)
PLATELETS: 225 10*3/uL (ref 150–440)
RBC: 3.82 MIL/uL (ref 3.80–5.20)
RDW: 13.4 % (ref 11.5–14.5)
WBC: 5.6 10*3/uL (ref 3.6–11.0)

## 2017-09-17 LAB — BASIC METABOLIC PANEL
Anion gap: 6 (ref 5–15)
BUN: 19 mg/dL (ref 8–23)
CALCIUM: 9.1 mg/dL (ref 8.9–10.3)
CHLORIDE: 111 mmol/L (ref 98–111)
CO2: 24 mmol/L (ref 22–32)
Creatinine, Ser: 1.13 mg/dL — ABNORMAL HIGH (ref 0.44–1.00)
GFR calc Af Amer: 53 mL/min — ABNORMAL LOW (ref >60)
GFR calc non Af Amer: 45 mL/min — ABNORMAL LOW (ref >60)
GLUCOSE: 110 mg/dL — AB (ref 70–99)
Potassium: 4.1 mmol/L (ref 3.5–5.1)
Sodium: 141 mmol/L (ref 135–145)

## 2017-09-17 LAB — URINALYSIS, COMPLETE (UACMP) WITH MICROSCOPIC
Bacteria, UA: NONE SEEN
Bilirubin Urine: NEGATIVE
GLUCOSE, UA: NEGATIVE mg/dL
HGB URINE DIPSTICK: NEGATIVE
Ketones, ur: NEGATIVE mg/dL
Leukocytes, UA: NEGATIVE
Nitrite: NEGATIVE
Protein, ur: NEGATIVE mg/dL
SPECIFIC GRAVITY, URINE: 1.015 (ref 1.005–1.030)
pH: 6 (ref 5.0–8.0)

## 2017-09-17 MED ORDER — SODIUM CHLORIDE 0.9 % IV BOLUS
500.0000 mL | Freq: Once | INTRAVENOUS | Status: AC
  Administered 2017-09-17: 500 mL via INTRAVENOUS

## 2017-09-17 NOTE — ED Provider Notes (Signed)
[facility] Emergency Department Provider Note   ____________________________________________   First MD Initiated Contact with Patient 09/17/17 1855     (approximate)  I have reviewed the triage vital signs and the nursing notes.   HISTORY  Chief Complaint Dizziness    HPI Christy Green is a 77 y.o. female history of hypertension hyperlipidemia  Patient reports that for about 1 week now she has been feeling "dizzy".  She reports the feeling as being lightheaded.  No fevers or chills.  No headache.  No numbness tingling or weakness except she feels like her whole body just get lightheaded.  Denies a spinning sensation.  No ear pain.  Patient really does not report any other symptoms other than just feeling like she is very lightheaded off and on throughout the last 7 days.  Saw her primary care doctor, reviewed their note, and she reports she was diagnosed with vertigo but is taking Antivert and does not believe it is helping.  She has not had any falls or felt off balance but just feels as though she feels just very generally weak all over.   Past Medical History:  Diagnosis Date  . Arthritis   . GERD (gastroesophageal reflux disease)   . Hyperlipidemia   . Hypertension   . Osteoporosis   . Vitamin D deficiency     There are no active problems to display for this patient.   Past Surgical History:  Procedure Laterality Date  . ABDOMINAL HYSTERECTOMY    . COLONOSCOPY WITH PROPOFOL N/A 02/24/2015   Procedure: COLONOSCOPY WITH PROPOFOL;  Surgeon: Lollie Sails, MD;  Location: [facility] ENDOSCOPY;  Service: Endoscopy;  Laterality: N/A;  . ESOPHAGOGASTRODUODENOSCOPY (EGD) WITH PROPOFOL N/A 02/24/2015   Procedure: ESOPHAGOGASTRODUODENOSCOPY (EGD) WITH PROPOFOL;  Surgeon: Lollie Sails, MD;  Location: [facility] ENDOSCOPY;  Service: Endoscopy;  Laterality: N/A;  . EYE SURGERY      Prior to Admission medications   Medication Sig Start Date End Date  Taking? Authorizing Provider  amLODipine (NORVASC) 5 MG tablet Take 5 mg by mouth daily.    [provider]  aspirin EC 81 MG tablet Take 81 mg by mouth daily.    [provider]  etodolac (LODINE) 400 MG tablet Take 400 mg by mouth 2 (two) times daily.    [provider]  Multiple Vitamin (MULTIVITAMIN IRON-FREE PO) Take by mouth.    [provider]  Omega 3-6-9 Fatty Acids (OMEGA-3 & OMEGA-6 FISH OIL PO) Take by mouth.    [provider]  pantoprazole (PROTONIX) 40 MG tablet Take 40 mg by mouth daily.    [provider]  pediatric multivitamin + iron (POLY-VI-SOL +IRON) 10 MG/ML oral solution Take by mouth daily.    [provider]  pravastatin (PRAVACHOL) 10 MG tablet Take 10 mg by mouth daily.    [provider]  prednisoLONE acetate (PRED FORTE) 1 % ophthalmic suspension 1 drop 4 (four) times daily.    [provider]  sucralfate (CARAFATE) 1 G tablet Take 1 g by mouth 4 (four) times daily -  with meals and at bedtime.    [provider]    Allergies Patient has no known allergies.  Family History  Problem Relation Age of Onset  . Breast cancer Neg Hx     Social History Social History   Tobacco Use  . Smoking status: Never Smoker  . Smokeless tobacco: Never Used  Substance Use Topics  . Alcohol use: No  .  Drug use: No    Review of Systems Constitutional: No fever/chills Eyes: No visual changes. ENT: No sore throat. Cardiovascular: Denies chest pain. Respiratory: Denies shortness of breath. Gastrointestinal: No abdominal pain.  No nausea, no vomiting.  Slight decrease in appetite, but still eating.  In fact the patient reports she got very hungry and her family brought her and she just ate fried chicken from [place] Genitourinary: Negative for dysuria. Musculoskeletal: Negative for back pain. Skin: Negative for rash. Neurological: Negative for headaches, focal weakness or  numbness.    ____________________________________________   PHYSICAL EXAM:  VITAL SIGNS: ED Triage Vitals  Enc Vitals Group     BP 09/17/17 1527 (!) 195/69     Pulse Rate 09/17/17 1527 69     Resp 09/17/17 1527 18     Temp 09/17/17 1527 99.3 F (37.4 C)     Temp Source 09/17/17 1527 Oral     SpO2 09/17/17 1527 100 %     Weight 09/17/17 1527 141 lb (64 kg)     Height 09/17/17 1527 5\' 4"  (1.626 m)     Head Circumference --      Peak Flow --      Pain Score 09/17/17 1534 0     Pain Loc --      Pain Edu? --      Excl. in [place]? --     Constitutional: Alert and oriented. Well appearing and in no acute distress. Eyes: Conjunctivae are normal. Head: Atraumatic. Nose: No congestion/rhinnorhea. Mouth/Throat: Mucous membranes are moist. Neck: No stridor.  Tympanic membranes normal bilateral. Cardiovascular: Normal rate, regular rhythm. Grossly normal heart sounds.  Good peripheral circulation. Respiratory: Normal respiratory effort.  No retractions. Lungs CTAB. Gastrointestinal: Soft and nontender. No distention. Musculoskeletal: No lower extremity tenderness nor edema. Neurologic:  Normal speech and language. No gross focal neurologic deficits are appreciated.  Normal cranial nerve exam.  Normal extra ocular movements.  5 out of 5 strength in all extremities with normal sensation and grip strength bilateral.  Normal level of alertness.  Well oriented. Skin:  Skin is warm, dry and intact. No rash noted. Psychiatric: Mood and affect are normal. Speech and behavior are normal.  ____________________________________________   LABS (all labs ordered are listed, but only abnormal results are displayed)  Labs Reviewed  BASIC METABOLIC PANEL - Abnormal; Notable for the following components:      Result Value   Glucose, Bld 110 (*)    Creatinine, Ser 1.13 (*)    GFR calc non Af Amer 45 (*)    GFR calc Af Amer 53 (*)    All other components within normal limits  CBC - Abnormal;  Notable for the following components:   HCT 34.9 (*)    All other components within normal limits  URINALYSIS, COMPLETE (UACMP) WITH MICROSCOPIC - Abnormal; Notable for the following components:   Color, Urine YELLOW (*)    APPearance CLEAR (*)    All other components within normal limits  CBG MONITORING, ED   ____________________________________________  EKG  Reviewed and entered by me at 1540 Heart rate 79 QRS 90 QTc 410 Normal sinus rhythm, no evidence of acute ischemia, slight right bundle branch like  appearance in V1 V2. ____________________________________________  RADIOLOGY  Mr Brain Wo Contrast  Result Date: 09/17/2017 CLINICAL DATA:  Dizziness, 1 week duration. EXAM: MRI HEAD WITHOUT CONTRAST TECHNIQUE: Multiplanar, multiecho pulse sequences of the brain and surrounding structures were obtained without intravenous contrast. COMPARISON:  06/11/2008 FINDINGS: Brain: Diffusion  imaging does not show any acute or subacute infarction. There chronic small-vessel ischemic changes of the pons. There are moderate to marked chronic small-vessel ischemic changes of the cerebral hemispheric deep and subcortical white matter. No cortical or large vessel territory infarction. No evidence of intra-axial mass lesion, hemorrhage, hydrocephalus or extra-axial collection. Patient appears to have a pituitary mass measuring up to 1 cm in size. Recommend dedicated pituitary study for further evaluation. This can be done non emergently as this does not relate to the clinical presentation. Vascular: Major vessels at the base of the brain show flow. Skull and upper cervical spine: Negative Sinuses/Orbits: Clear/normal.  Previous scleral banding on the left. Other: None IMPRESSION: No acute or subacute infarction. No specific cause of dizziness identified. Moderate to marked chronic small-vessel ischemic changes of the cerebral hemispheric white matter and the pons, progressive since 2010. 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 Recommend dedicated pituitary MRI. This can be done non emergently, as it would not relate to this acute clinical presentation. Electronically Signed   By: Nelson Chimes M.D.   On: 09/17/2017 20:52    ____________________________________________   PROCEDURES  Procedure(s) performed: None  Procedures  Critical Care performed: No  ____________________________________________   INITIAL IMPRESSION / ASSESSMENT AND PLAN / ED COURSE  Pertinent labs & imaging results that were available during my care of the patient were reviewed by me and considered in my medical decision making (see chart for details).  Patient presents for evaluation of "dizziness" which clinical history taking sounds more like lightheadedness with some just feeling of generalized weakness mostly with ambulation.  Alert oriented very reassuring examination, seen by primary recommend a CT if ongoing symptoms, at this point will obtain an MRI as a suspect some slightly better clinical utility, but overall doubt an acute ischemic event or acute neurologic abnormality, but given the persistence of symptoms patient age will proceed with MRI.  Lab work indicates possibly some mild dehydration as well, will hydrate here and reassess.  No evidence of acute cardiac etiology.  Reassuring exam, no infectious symptoms.  ----------------------------------------- 10:08 PM on 09/17/2017 -----------------------------------------  Patient ambulating, feeling well.  Reports feeling improvement with hydration.  MRI reassuring, discussed pituitary adenoma and need for close follow-up in dedicated MRI of the pituitary with the patient and her family, they are in agreement.  Will follow closely.  Return precautions and treatment recommendations and follow-up discussed with the patient who is agreeable with the plan.       ____________________________________________   FINAL CLINICAL  IMPRESSION(S) / ED DIAGNOSES  Final diagnoses:  Mild dehydration  Pituitary adenoma ([place])      NEW MEDICATIONS STARTED DURING THIS VISIT:  New Prescriptions   No medications on file     Note:  This document was prepared using Dragon voice recognition software and may include unintentional dictation errors.     Delman Kitten, MD 09/17/17 2208

## 2017-09-17 NOTE — ED Triage Notes (Signed)
Pt arrives to ED c/o dizziness x 1 week. States saw [facility] on Thursday and was prescribed a medicine but doesn't know what it is. States more room spinning then syncopal feeling. Alert, oriented, ambulatory. Denies pain. Speech clear.

## 2017-09-17 NOTE — ED Notes (Signed)
Patient transported to MRI with EDT.

## 2017-09-25 ENCOUNTER — Other Ambulatory Visit: Payer: Self-pay | Admitting: Internal Medicine

## 2017-09-25 DIAGNOSIS — E236 Other disorders of pituitary gland: Secondary | ICD-10-CM

## 2017-10-06 ENCOUNTER — Ambulatory Visit
Admission: RE | Admit: 2017-10-06 | Discharge: 2017-10-06 | Disposition: A | Discharge status: Home / Self Care | Payer: Medicare HMO | Source: Ambulatory Visit | Attending: Internal Medicine | Admitting: Internal Medicine

## 2017-10-06 DIAGNOSIS — D352 Benign neoplasm of pituitary gland: Secondary | ICD-10-CM | POA: Diagnosis not present

## 2017-10-06 DIAGNOSIS — E237 Disorder of pituitary gland, unspecified: Secondary | ICD-10-CM | POA: Diagnosis not present

## 2017-10-06 DIAGNOSIS — E236 Other disorders of pituitary gland: Secondary | ICD-10-CM

## 2017-10-06 MED ORDER — GADOBENATE DIMEGLUMINE 529 MG/ML IV SOLN
7.0000 mL | Freq: Once | INTRAVENOUS | Status: AC | PRN
  Administered 2017-10-06: 7 mL via INTRAVENOUS

## 2017-10-17 DIAGNOSIS — R490 Dysphonia: Secondary | ICD-10-CM | POA: Diagnosis not present

## 2017-10-17 DIAGNOSIS — R42 Dizziness and giddiness: Secondary | ICD-10-CM | POA: Diagnosis not present

## 2017-10-17 DIAGNOSIS — D352 Benign neoplasm of pituitary gland: Secondary | ICD-10-CM | POA: Diagnosis not present

## 2017-10-17 DIAGNOSIS — R202 Paresthesia of skin: Secondary | ICD-10-CM | POA: Diagnosis not present

## 2017-10-17 DIAGNOSIS — I10 Essential (primary) hypertension: Secondary | ICD-10-CM | POA: Diagnosis not present

## 2017-10-19 DIAGNOSIS — D352 Benign neoplasm of pituitary gland: Secondary | ICD-10-CM | POA: Diagnosis not present

## 2017-11-02 DIAGNOSIS — H2511 Age-related nuclear cataract, right eye: Secondary | ICD-10-CM | POA: Diagnosis not present

## 2017-11-03 DIAGNOSIS — R490 Dysphonia: Secondary | ICD-10-CM | POA: Diagnosis not present

## 2017-11-03 DIAGNOSIS — R1310 Dysphagia, unspecified: Secondary | ICD-10-CM | POA: Diagnosis not present

## 2017-11-03 DIAGNOSIS — K219 Gastro-esophageal reflux disease without esophagitis: Secondary | ICD-10-CM | POA: Diagnosis not present

## 2017-11-03 DIAGNOSIS — J301 Allergic rhinitis due to pollen: Secondary | ICD-10-CM | POA: Diagnosis not present

## 2017-11-03 DIAGNOSIS — Z87891 Personal history of nicotine dependence: Secondary | ICD-10-CM | POA: Diagnosis not present

## 2017-11-27 DIAGNOSIS — D352 Benign neoplasm of pituitary gland: Secondary | ICD-10-CM | POA: Diagnosis not present

## 2017-11-27 DIAGNOSIS — R42 Dizziness and giddiness: Secondary | ICD-10-CM | POA: Diagnosis not present

## 2017-12-26 DIAGNOSIS — H2511 Age-related nuclear cataract, right eye: Secondary | ICD-10-CM | POA: Diagnosis not present

## 2017-12-28 ENCOUNTER — Encounter: Payer: Self-pay | Admitting: *Deleted

## 2018-01-02 ENCOUNTER — Ambulatory Visit
Admission: RE | Admit: 2018-01-02 | Discharge: 2018-01-02 | Disposition: A | Discharge status: Home / Self Care | Payer: Medicare HMO | Source: Ambulatory Visit | Attending: Ophthalmology | Admitting: Ophthalmology

## 2018-01-02 ENCOUNTER — Encounter: Payer: Self-pay | Admitting: Emergency Medicine

## 2018-01-02 ENCOUNTER — Encounter
Admission: RE | Disposition: A | Discharge status: Home / Self Care | Payer: Self-pay | Source: Ambulatory Visit | Attending: Ophthalmology

## 2018-01-02 ENCOUNTER — Ambulatory Visit: Payer: Medicare HMO | Admitting: Anesthesiology

## 2018-01-02 ENCOUNTER — Other Ambulatory Visit: Payer: Self-pay

## 2018-01-02 DIAGNOSIS — M810 Age-related osteoporosis without current pathological fracture: Secondary | ICD-10-CM | POA: Insufficient documentation

## 2018-01-02 DIAGNOSIS — E785 Hyperlipidemia, unspecified: Secondary | ICD-10-CM | POA: Insufficient documentation

## 2018-01-02 DIAGNOSIS — Z7982 Long term (current) use of aspirin: Secondary | ICD-10-CM | POA: Insufficient documentation

## 2018-01-02 DIAGNOSIS — H2511 Age-related nuclear cataract, right eye: Secondary | ICD-10-CM | POA: Diagnosis not present

## 2018-01-02 DIAGNOSIS — F0390 Unspecified dementia without behavioral disturbance: Secondary | ICD-10-CM | POA: Insufficient documentation

## 2018-01-02 DIAGNOSIS — M1990 Unspecified osteoarthritis, unspecified site: Secondary | ICD-10-CM | POA: Insufficient documentation

## 2018-01-02 DIAGNOSIS — K219 Gastro-esophageal reflux disease without esophagitis: Secondary | ICD-10-CM | POA: Diagnosis not present

## 2018-01-02 DIAGNOSIS — I10 Essential (primary) hypertension: Secondary | ICD-10-CM | POA: Insufficient documentation

## 2018-01-02 DIAGNOSIS — Z79899 Other long term (current) drug therapy: Secondary | ICD-10-CM | POA: Insufficient documentation

## 2018-01-02 HISTORY — DX: Dizziness and giddiness: R42

## 2018-01-02 HISTORY — DX: Unspecified dementia, unspecified severity, without behavioral disturbance, psychotic disturbance, mood disturbance, and anxiety: F03.90

## 2018-01-02 HISTORY — DX: Benign neoplasm of pituitary gland: D35.2

## 2018-01-02 HISTORY — PX: CATARACT EXTRACTION W/PHACO: SHX586

## 2018-01-02 HISTORY — DX: Unspecified hearing loss, unspecified ear: H91.90

## 2018-01-02 SURGERY — PHACOEMULSIFICATION, CATARACT, WITH IOL INSERTION
Anesthesia: Monitor Anesthesia Care | Site: Eye | Laterality: Right

## 2018-01-02 MED ORDER — NA CHONDROIT SULF-NA HYALURON 40-17 MG/ML IO SOLN
INTRAOCULAR | Status: AC
  Filled 2018-01-02: qty 1

## 2018-01-02 MED ORDER — MIDAZOLAM HCL 2 MG/2ML IJ SOLN
INTRAMUSCULAR | Status: DC | PRN
  Administered 2018-01-02: 1 mg via INTRAVENOUS

## 2018-01-02 MED ORDER — ARMC OPHTHALMIC DILATING DROPS
OPHTHALMIC | Status: AC
  Administered 2018-01-02: 1 via OPHTHALMIC
  Filled 2018-01-02: qty 0.5

## 2018-01-02 MED ORDER — TETRACAINE HCL 0.5 % OP SOLN
1.0000 [drp] | OPHTHALMIC | Status: AC | PRN
  Administered 2018-01-02 (×3): 1 [drp] via OPHTHALMIC

## 2018-01-02 MED ORDER — POVIDONE-IODINE 5 % OP SOLN
OPHTHALMIC | Status: AC
  Filled 2018-01-02: qty 30

## 2018-01-02 MED ORDER — SODIUM CHLORIDE 0.9 % IV SOLN
INTRAVENOUS | Status: DC
  Administered 2018-01-02: 07:00:00 via INTRAVENOUS

## 2018-01-02 MED ORDER — EPINEPHRINE PF 1 MG/ML IJ SOLN
INTRAMUSCULAR | Status: AC
  Filled 2018-01-02: qty 2

## 2018-01-02 MED ORDER — LIDOCAINE HCL (PF) 4 % IJ SOLN
INTRAOCULAR | Status: DC | PRN
  Administered 2018-01-02: 4 mL via OPHTHALMIC

## 2018-01-02 MED ORDER — NA CHONDROIT SULF-NA HYALURON 40-17 MG/ML IO SOLN
INTRAOCULAR | Status: DC | PRN
  Administered 2018-01-02: 1 mL via INTRAOCULAR

## 2018-01-02 MED ORDER — FENTANYL CITRATE (PF) 100 MCG/2ML IJ SOLN
INTRAMUSCULAR | Status: DC | PRN
  Administered 2018-01-02: 50 ug via INTRAVENOUS

## 2018-01-02 MED ORDER — ARMC OPHTHALMIC DILATING DROPS
1.0000 "application " | OPHTHALMIC | Status: AC
  Administered 2018-01-02 (×3): 1 via OPHTHALMIC

## 2018-01-02 MED ORDER — POVIDONE-IODINE 5 % OP SOLN
OPHTHALMIC | Status: DC | PRN
  Administered 2018-01-02: 1 via OPHTHALMIC

## 2018-01-02 MED ORDER — TETRACAINE HCL 0.5 % OP SOLN
OPHTHALMIC | Status: AC
  Administered 2018-01-02: 1 [drp] via OPHTHALMIC
  Filled 2018-01-02: qty 4

## 2018-01-02 MED ORDER — CARBACHOL 0.01 % IO SOLN
INTRAOCULAR | Status: DC | PRN
  Administered 2018-01-02: 0.5 mL via INTRAOCULAR

## 2018-01-02 MED ORDER — MOXIFLOXACIN HCL 0.5 % OP SOLN: 1.0000 [drp] | OPHTHALMIC | Status: DC | PRN

## 2018-01-02 MED ORDER — FENTANYL CITRATE (PF) 100 MCG/2ML IJ SOLN
INTRAMUSCULAR | Status: AC
  Filled 2018-01-02: qty 2

## 2018-01-02 MED ORDER — EPINEPHRINE PF 1 MG/ML IJ SOLN
INTRAOCULAR | Status: DC | PRN
  Administered 2018-01-02: 08:00:00 via OPHTHALMIC

## 2018-01-02 MED ORDER — LIDOCAINE HCL (PF) 4 % IJ SOLN
INTRAMUSCULAR | Status: AC
  Filled 2018-01-02: qty 5

## 2018-01-02 MED ORDER — MOXIFLOXACIN HCL 0.5 % OP SOLN
OPHTHALMIC | Status: DC | PRN
  Administered 2018-01-02: 0.2 mL via OPHTHALMIC

## 2018-01-02 MED ORDER — MOXIFLOXACIN HCL 0.5 % OP SOLN
OPHTHALMIC | Status: AC
  Filled 2018-01-02: qty 3

## 2018-01-02 MED ORDER — MIDAZOLAM HCL 2 MG/2ML IJ SOLN
INTRAMUSCULAR | Status: AC
  Filled 2018-01-02: qty 2

## 2018-01-02 SURGICAL SUPPLY — 17 items
GLOVE BIO SURGEON STRL SZ8 (GLOVE) ×3 IMPLANT
GLOVE BIOGEL M 6.5 STRL (GLOVE) ×3 IMPLANT
GLOVE SURG LX 8.0 MICRO (GLOVE) ×2
GLOVE SURG LX STRL 8.0 MICRO (GLOVE) ×1 IMPLANT
GOWN STRL REUS W/ TWL LRG LVL3 (GOWN DISPOSABLE) ×2 IMPLANT
GOWN STRL REUS W/TWL LRG LVL3 (GOWN DISPOSABLE) ×4
LABEL CATARACT MEDS ST (LABEL) ×3 IMPLANT
LENS IOL ACRYSOF IQ 22.5 (Intraocular Lens) ×3 IMPLANT
PACK CATARACT (MISCELLANEOUS) ×3 IMPLANT
PACK CATARACT BRASINGTON LX (MISCELLANEOUS) ×3 IMPLANT
PACK EYE AFTER SURG (MISCELLANEOUS) ×3 IMPLANT
RING MALYGIN (MISCELLANEOUS) ×3 IMPLANT
SOL BSS BAG (MISCELLANEOUS) ×3
SOLUTION BSS BAG (MISCELLANEOUS) ×1 IMPLANT
SYR 5ML LL (SYRINGE) ×3 IMPLANT
WATER STERILE IRR 250ML POUR (IV SOLUTION) ×3 IMPLANT
WIPE NON LINTING 3.25X3.25 (MISCELLANEOUS) ×3 IMPLANT

## 2018-01-02 NOTE — Anesthesia Preprocedure Evaluation (Signed)
Anesthesia Evaluation  Patient identified by MRN, date of birth, ID band Patient awake    Reviewed: Allergy & Precautions, NPO status , Patient's Chart, lab work & pertinent test results  History of Anesthesia Complications Negative for: history of anesthetic complications  Airway Mallampati: II  TM Distance: >3 FB Neck ROM: Full    Dental no notable dental hx.    Pulmonary neg sleep apnea, neg COPD,    breath sounds clear to auscultation- rhonchi (-) wheezing      Cardiovascular Exercise Tolerance: Good hypertension, Pt. on medications (-) CAD, (-) Past MI, (-) Cardiac Stents and (-) CABG  Rhythm:Regular Rate:Normal - Systolic murmurs and - Diastolic murmurs    Neuro/Psych PSYCHIATRIC DISORDERS Dementia negative neurological ROS     GI/Hepatic Neg liver ROS, GERD  ,  Endo/Other  negative endocrine ROSneg diabetes  Renal/GU negative Renal ROS     Musculoskeletal  (+) Arthritis ,   Abdominal (+) - obese,   Peds  Hematology negative hematology ROS (+)   Anesthesia Other Findings Past Medical History: No date: Arthritis No date: Dementia (HCC) No date: Dizziness No date: GERD (gastroesophageal reflux disease) No date: HOH (hard of hearing)     Comment:  AID No date: Hyperlipidemia No date: Hypertension No date: Osteoporosis No date: Pituitary adenoma (HCC) No date: Vitamin D deficiency   Reproductive/Obstetrics                             Anesthesia Physical Anesthesia Plan  ASA: II  Anesthesia Plan: MAC   Post-op Pain Management:    Induction: Intravenous  PONV Risk Score and Plan: 2 and Midazolam  Airway Management Planned: Natural Airway  Additional Equipment:   Intra-op Plan:   Post-operative Plan:   Informed Consent: I have reviewed the patients History and Physical, chart, labs and discussed the procedure including the risks, benefits and alternatives for the  proposed anesthesia with the patient or authorized representative who has indicated his/her understanding and acceptance.     Plan Discussed with: CRNA and Anesthesiologist  Anesthesia Plan Comments:         Anesthesia Quick Evaluation

## 2018-01-02 NOTE — Transfer of Care (Signed)
Immediate Anesthesia Transfer of Care Note  Patient: Christy Green  Procedure(s) Performed: CATARACT EXTRACTION PHACO AND INTRAOCULAR LENS PLACEMENT (IOC) (Right Eye)  Patient Location: PACU  Anesthesia Type:MAC  Level of Consciousness: awake, alert  and oriented  Airway & Oxygen Therapy: Patient Spontanous Breathing  Post-op Assessment: Report given to RN and Post -op Vital signs reviewed and stable  Post vital signs: Reviewed and stable  Last Vitals:  Vitals Value Taken Time  BP    Temp    Pulse    Resp    SpO2      Last Pain:  Vitals:   01/02/18 0637  TempSrc: Temporal  PainSc: 0-No pain         Complications: No apparent anesthesia complications

## 2018-01-02 NOTE — H&P (Signed)
All labs reviewed. Abnormal studies sent to patients PCP when indicated.  Previous H&P reviewed, patient examined, there are NO CHANGES.  Long Brimage Porfilio10/29/20197:52 AM

## 2018-01-02 NOTE — Anesthesia Post-op Follow-up Note (Signed)
Anesthesia QCDR form completed.        

## 2018-01-02 NOTE — Discharge Instructions (Signed)
Eye Surgery Discharge Instructions    Expect mild scratchy sensation or mild soreness. DO NOT RUB YOUR EYE!  The day of surgery:  Minimal physical activity, but bed rest is not required  No reading, computer work, or close hand work  No bending, lifting, or straining.  May watch TV  For 24 hours:  No driving, legal decisions, or alcoholic beverages  Safety precautions  Eat anything you prefer: It is better to start with liquids, then soup then solid foods.  _____ Eye patch should be worn until postoperative exam tomorrow.  ____ Solar shield eyeglasses should be worn for comfort in the sunlight/patch while sleeping  Resume all regular medications including aspirin or Coumadin if these were discontinued prior to surgery. You may shower, bathe, shave, or wash your hair. Tylenol may be taken for mild discomfort.  Call your doctor if you experience significant pain, nausea, or vomiting, fever > 101 or other signs of infection. [phone] or [phone] Specific instructions:  Follow-up Information    Birder Robson, MD Follow up on 01/03/2018.   Specialty:  Ophthalmology Why:  9:10 Contact information: [address] [place] [address] [phone]

## 2018-01-02 NOTE — Op Note (Signed)
PREOPERATIVE DIAGNOSIS:  Nuclear sclerotic cataract of the right eye.   POSTOPERATIVE DIAGNOSIS:  NUCLEAR SCLEROTIC CATARACT RIGHT EYE   OPERATIVE PROCEDURE: Procedure(s): CATARACT EXTRACTION PHACO AND INTRAOCULAR LENS PLACEMENT (IOC)   SURGEON:  Birder Robson, MD.   ANESTHESIA:  Anesthesiologist: Emmie Niemann, MD CRNA: Philbert Riser, CRNA  1.      Managed anesthesia care. 2.      0.78ml of Shugarcaine was instilled in the eye following the paracentesis.   COMPLICATIONS: Viscoelastic was used to raise the pupil margin.  A  Malyugin ring was placed as the pupil would not achieve sufficient pharmacologic dilation to undergo cataract extraction safely.( The ring was removed atraumatically following insertion of the IOL.) .   TECHNIQUE:   Stop and chop   DESCRIPTION OF PROCEDURE:  The patient was examined and consented in the preoperative holding area where the aforementioned topical anesthesia was applied to the right eye and then brought back to the Operating Room where the right eye was prepped and draped in the usual sterile ophthalmic fashion and a lid speculum was placed. A paracentesis was created with the side port blade and the anterior chamber was filled with viscoelastic. A near clear corneal incision was performed with the steel keratome. A continuous curvilinear capsulorrhexis was performed with a cystotome followed by the capsulorrhexis forceps. Hydrodissection and hydrodelineation were carried out with BSS on a blunt cannula. The lens was removed in a stop and chop  technique and the remaining cortical material was removed with the irrigation-aspiration handpiece. The capsular bag was inflated with viscoelastic and the Technis [id number]  lens was placed in the capsular bag without complication. The remaining viscoelastic was removed from the eye with the irrigation-aspiration handpiece. The wounds were hydrated. The anterior chamber was flushed with Miostat and the eye was inflated  to physiologic pressure. 0.64ml of Vigamox was placed in the anterior chamber. The wounds were found to be water tight. The eye was dressed with Vigamox. The patient was given protective glasses to wear throughout the day and a shield with which to sleep tonight. The patient was also given drops with which to begin a drop regimen today and will follow-up with me in one day. Implant Name Type Inv. Item Serial No. Manufacturer Lot No. LRB No. Used  LENS IOL ACRYSOF IQ 22.5 - [id number] Intraocular Lens LENS IOL ACRYSOF IQ 22.5 [id number] ALCON  Right 1   Procedure(s) with comments: CATARACT EXTRACTION PHACO AND INTRAOCULAR LENS PLACEMENT (IOC) (Right) - [place] 01:09.9 CDE 12.94 Fluid Pack Lot # [id number]  Electronically signed: Birder Robson 01/02/2018 8:21 AM

## 2018-01-02 NOTE — Anesthesia Postprocedure Evaluation (Signed)
Anesthesia Post Note  Patient: Christy Green  Procedure(s) Performed: CATARACT EXTRACTION PHACO AND INTRAOCULAR LENS PLACEMENT ([place]) (Right Eye)  Patient location during evaluation: PACU Anesthesia Type: MAC Level of consciousness: awake, awake and alert and oriented Pain management: pain level controlled Vital Signs Assessment: post-procedure vital signs reviewed and stable Respiratory status: spontaneous breathing Cardiovascular status: blood pressure returned to baseline Postop Assessment: no headache and no apparent nausea or vomiting Anesthetic complications: no     Last Vitals:  Vitals:   01/02/18 0637  BP: (!) 151/58  Pulse: 69  Resp: 17  Temp: (!) 36 C  SpO2: 100%    Last Pain:  Vitals:   01/02/18 0637  TempSrc: Temporal  PainSc: 0-No pain                 Philbert Riser

## 2018-02-21 DIAGNOSIS — I10 Essential (primary) hypertension: Secondary | ICD-10-CM | POA: Diagnosis not present

## 2018-02-21 DIAGNOSIS — D352 Benign neoplasm of pituitary gland: Secondary | ICD-10-CM | POA: Diagnosis not present

## 2018-02-21 DIAGNOSIS — F320 Major depressive disorder, single episode, mild: Secondary | ICD-10-CM | POA: Diagnosis not present

## 2018-02-21 DIAGNOSIS — R2689 Other abnormalities of gait and mobility: Secondary | ICD-10-CM | POA: Diagnosis not present

## 2018-03-06 DIAGNOSIS — Z961 Presence of intraocular lens: Secondary | ICD-10-CM | POA: Diagnosis not present

## 2018-03-26 DIAGNOSIS — D352 Benign neoplasm of pituitary gland: Secondary | ICD-10-CM | POA: Diagnosis not present

## 2018-03-26 DIAGNOSIS — R42 Dizziness and giddiness: Secondary | ICD-10-CM | POA: Diagnosis not present

## 2018-03-27 ENCOUNTER — Other Ambulatory Visit: Payer: Self-pay | Admitting: Neurology

## 2018-03-27 DIAGNOSIS — D352 Benign neoplasm of pituitary gland: Secondary | ICD-10-CM

## 2018-04-11 ENCOUNTER — Ambulatory Visit
Admission: RE | Admit: 2018-04-11 | Discharge: 2018-04-11 | Disposition: A | Discharge status: Home / Self Care | Payer: Medicare HMO | Source: Ambulatory Visit | Attending: Neurology | Admitting: Neurology

## 2018-04-11 DIAGNOSIS — D352 Benign neoplasm of pituitary gland: Secondary | ICD-10-CM

## 2018-04-11 MED ORDER — GADOBUTROL 1 MMOL/ML IV SOLN
6.0000 mL | Freq: Once | INTRAVENOUS | Status: AC | PRN
  Administered 2018-04-11: 6 mL via INTRAVENOUS

## 2018-05-01 DIAGNOSIS — Z1239 Encounter for other screening for malignant neoplasm of breast: Secondary | ICD-10-CM | POA: Diagnosis not present

## 2018-05-01 DIAGNOSIS — G301 Alzheimer's disease with late onset: Secondary | ICD-10-CM | POA: Diagnosis not present

## 2018-05-01 DIAGNOSIS — F0280 Dementia in other diseases classified elsewhere without behavioral disturbance: Secondary | ICD-10-CM | POA: Diagnosis not present

## 2018-05-01 DIAGNOSIS — I10 Essential (primary) hypertension: Secondary | ICD-10-CM | POA: Diagnosis not present

## 2018-05-02 ENCOUNTER — Other Ambulatory Visit: Payer: Self-pay | Admitting: Internal Medicine

## 2018-05-02 DIAGNOSIS — Z1231 Encounter for screening mammogram for malignant neoplasm of breast: Secondary | ICD-10-CM

## 2018-05-08 DIAGNOSIS — D352 Benign neoplasm of pituitary gland: Secondary | ICD-10-CM | POA: Diagnosis not present

## 2018-06-05 IMAGING — MG MM DIGITAL SCREENING BILAT W/ TOMO W/ CAD
9 of 13 series · 9 of 29 positions shown · non-contrast
Comparison: Previous exam(s).

CLINICAL DATA: Screening.

EXAM:
DIGITAL SCREENING BILATERAL MAMMOGRAM WITH TOMO AND CAD

[R CC (1 of 2)]
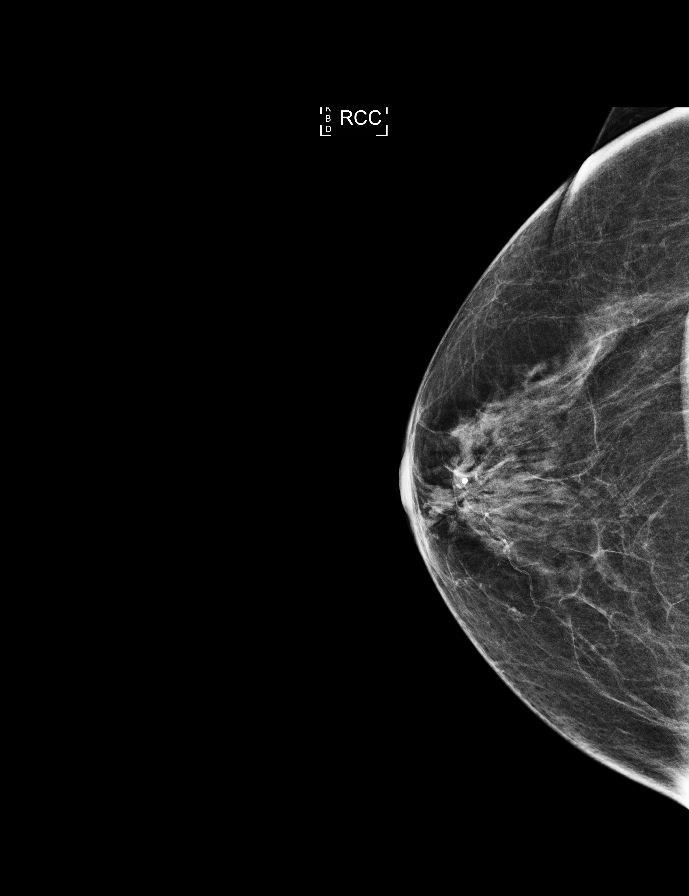

[L CC]
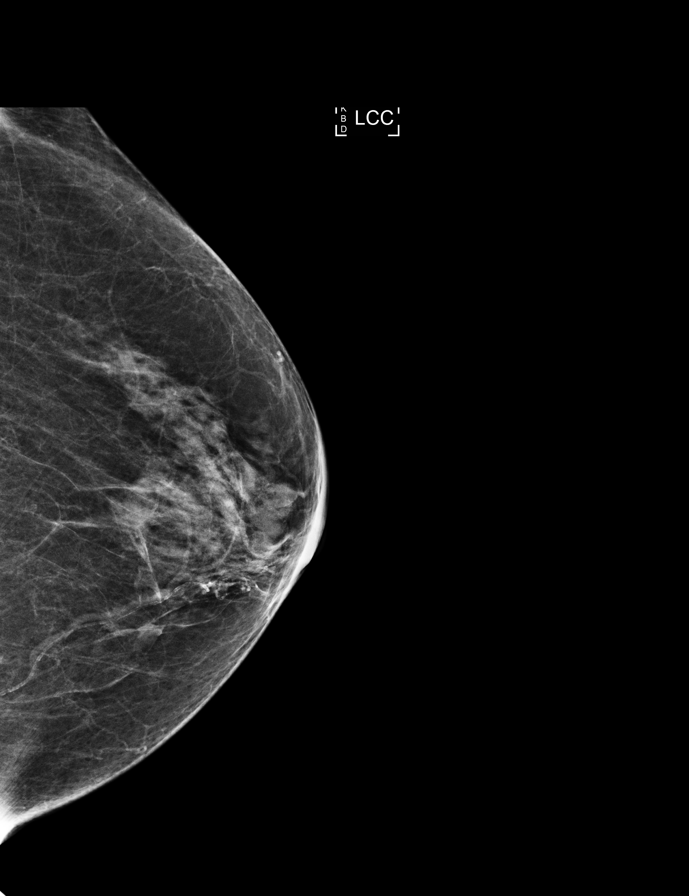

[L CC synth-2D]
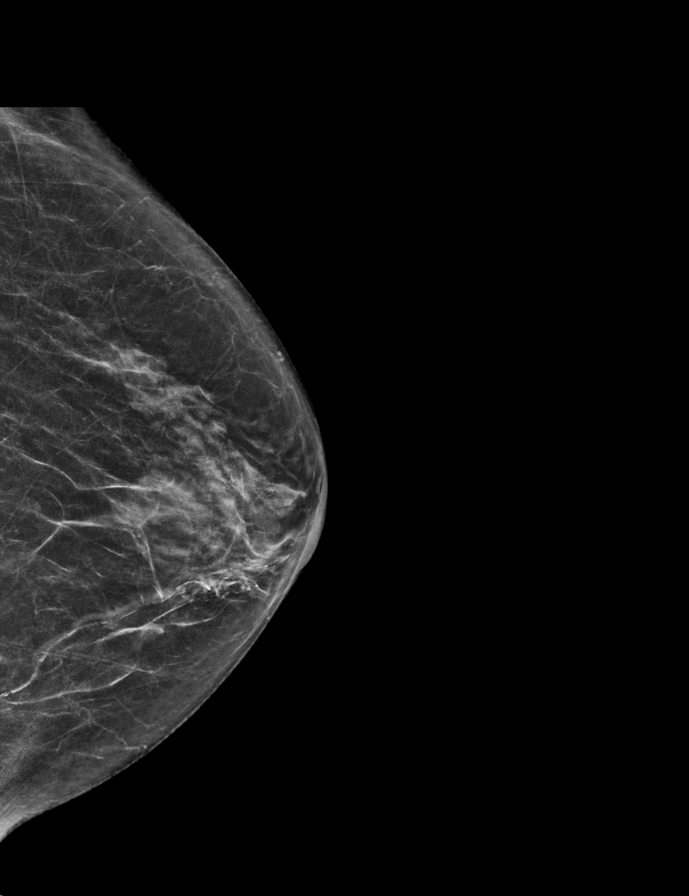

[R MLO synth-2D]
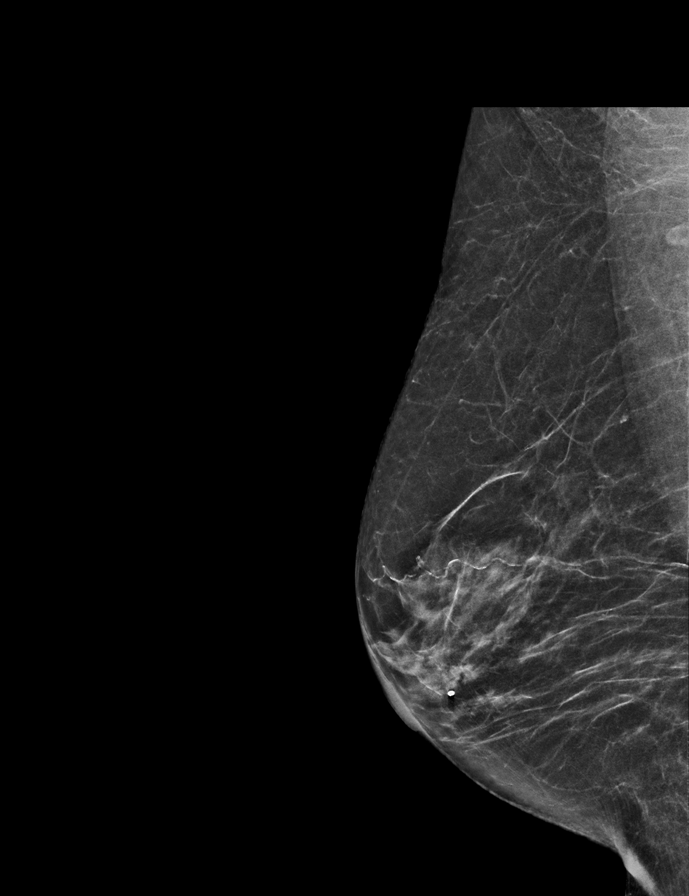

[R MLO]
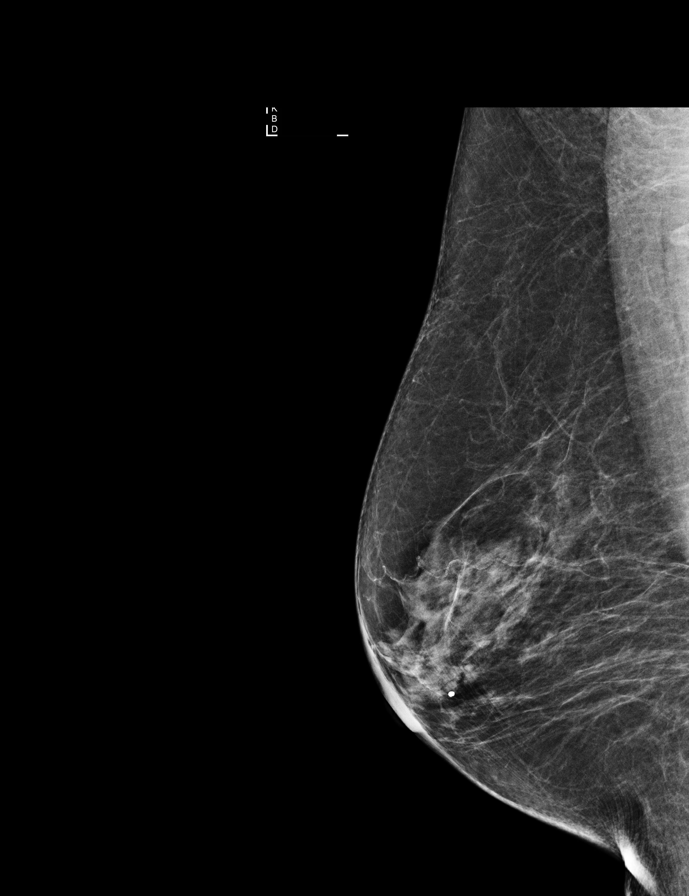

[R CC synth-2D]
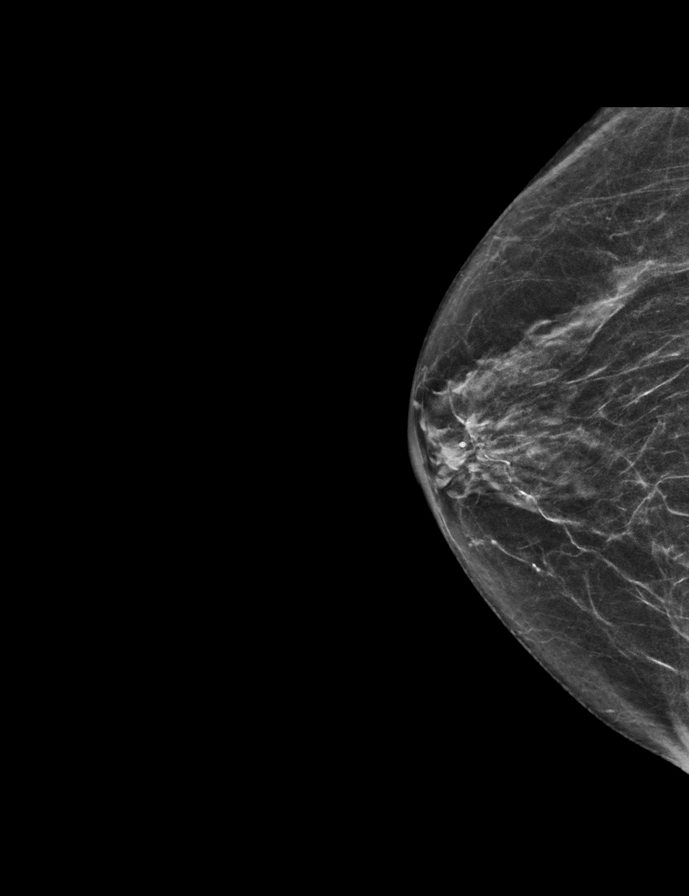

[L MLO synth-2D]
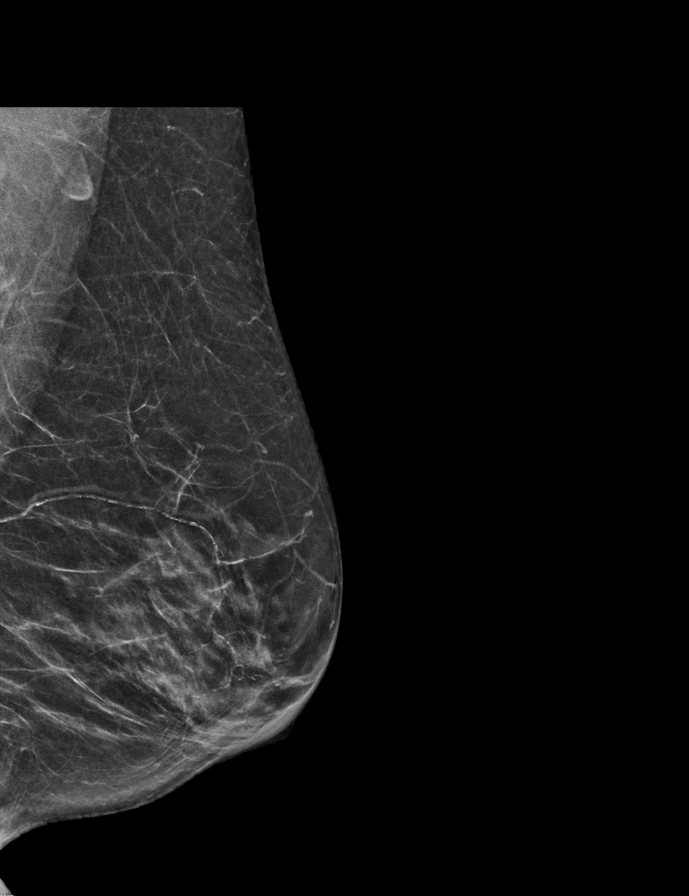

[R CC (2 of 2)]
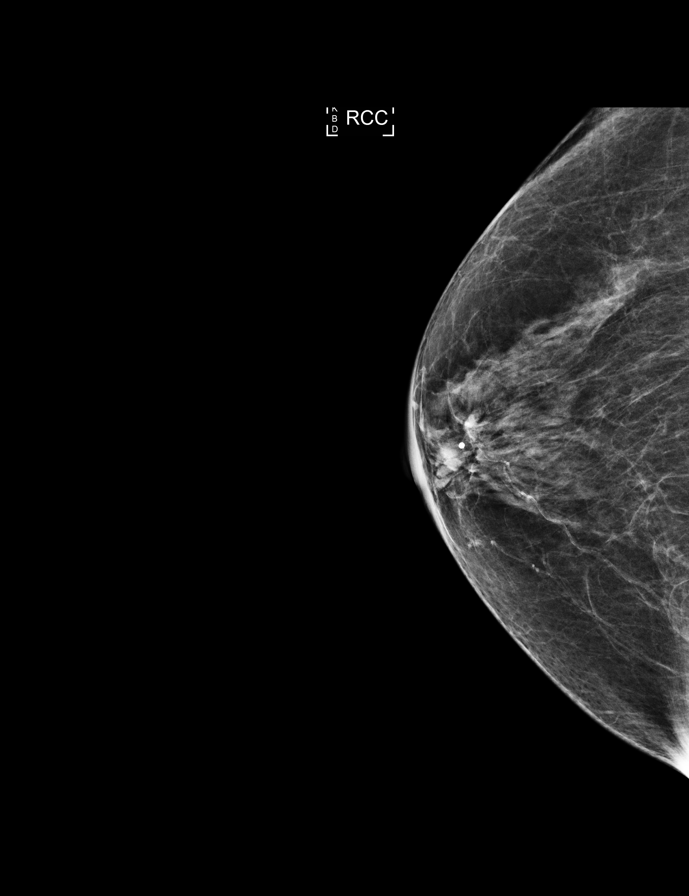

[L MLO]
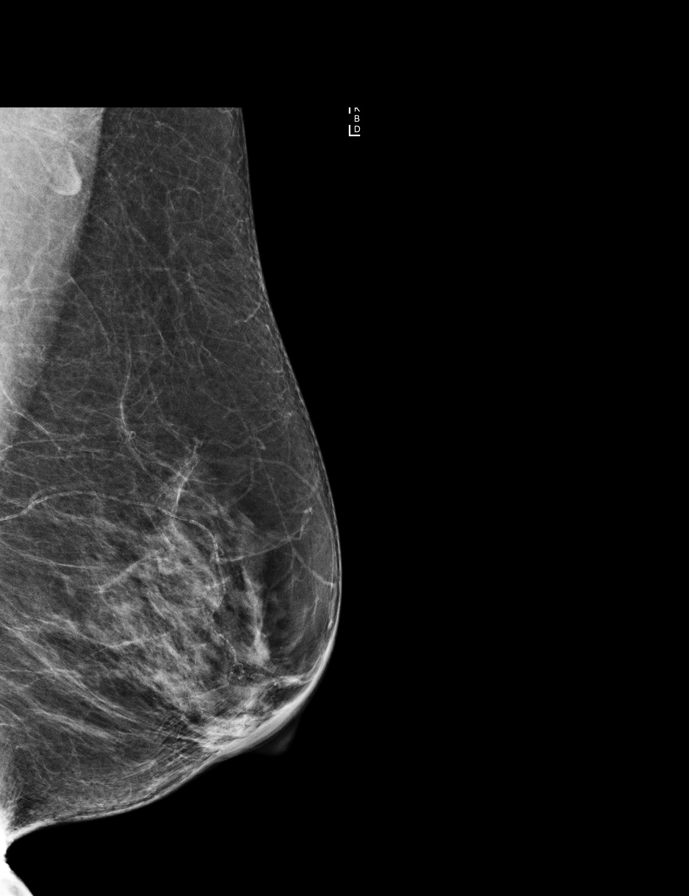

[9 of 29 positions shown; findings below may reference images not displayed]

ACR Breast Density Category b: There are scattered areas of
fibroglandular density.
FINDINGS: There are no findings suspicious for malignancy. Images were
processed with CAD.
IMPRESSION: No mammographic evidence of malignancy. A result letter of this
screening mammogram will be mailed directly to the patient.

RECOMMENDATION:
Screening mammogram in one year. (Code:[id number])

BI-RADS CATEGORY  1: Negative.

## 2018-06-13 DIAGNOSIS — H2000 Unspecified acute and subacute iridocyclitis: Secondary | ICD-10-CM | POA: Diagnosis not present

## 2018-07-04 DIAGNOSIS — H2000 Unspecified acute and subacute iridocyclitis: Secondary | ICD-10-CM | POA: Diagnosis not present

## 2018-07-10 DIAGNOSIS — D352 Benign neoplasm of pituitary gland: Secondary | ICD-10-CM | POA: Diagnosis not present

## 2018-10-17 ENCOUNTER — Ambulatory Visit
Admission: RE | Admit: 2018-10-17 | Discharge: 2018-10-17 | Disposition: A | Discharge status: Home / Self Care | Payer: Medicare HMO | Source: Ambulatory Visit | Attending: Internal Medicine | Admitting: Internal Medicine

## 2018-10-17 DIAGNOSIS — Z1231 Encounter for screening mammogram for malignant neoplasm of breast: Secondary | ICD-10-CM | POA: Diagnosis not present

## 2018-10-19 DIAGNOSIS — R42 Dizziness and giddiness: Secondary | ICD-10-CM | POA: Diagnosis not present

## 2018-10-19 DIAGNOSIS — R7303 Prediabetes: Secondary | ICD-10-CM | POA: Diagnosis not present

## 2018-10-19 DIAGNOSIS — E559 Vitamin D deficiency, unspecified: Secondary | ICD-10-CM | POA: Diagnosis not present

## 2018-10-19 DIAGNOSIS — E7849 Other hyperlipidemia: Secondary | ICD-10-CM | POA: Diagnosis not present

## 2018-10-19 DIAGNOSIS — D352 Benign neoplasm of pituitary gland: Secondary | ICD-10-CM | POA: Diagnosis not present

## 2018-10-19 DIAGNOSIS — Z0000 Encounter for general adult medical examination without abnormal findings: Secondary | ICD-10-CM | POA: Diagnosis not present

## 2018-10-19 DIAGNOSIS — Z780 Asymptomatic menopausal state: Secondary | ICD-10-CM | POA: Diagnosis not present

## 2018-10-19 DIAGNOSIS — K5909 Other constipation: Secondary | ICD-10-CM | POA: Diagnosis not present

## 2018-10-19 DIAGNOSIS — I10 Essential (primary) hypertension: Secondary | ICD-10-CM | POA: Diagnosis not present

## 2018-10-29 DIAGNOSIS — Z20828 Contact with and (suspected) exposure to other viral communicable diseases: Secondary | ICD-10-CM | POA: Diagnosis not present

## 2018-11-08 DIAGNOSIS — D352 Benign neoplasm of pituitary gland: Secondary | ICD-10-CM | POA: Diagnosis not present

## 2018-11-16 DIAGNOSIS — M810 Age-related osteoporosis without current pathological fracture: Secondary | ICD-10-CM | POA: Diagnosis not present

## 2019-03-18 ENCOUNTER — Other Ambulatory Visit: Payer: Self-pay | Admitting: Neurosurgery

## 2019-03-18 DIAGNOSIS — R918 Other nonspecific abnormal finding of lung field: Secondary | ICD-10-CM | POA: Diagnosis not present

## 2019-03-18 DIAGNOSIS — F0280 Dementia in other diseases classified elsewhere without behavioral disturbance: Secondary | ICD-10-CM | POA: Diagnosis not present

## 2019-03-18 DIAGNOSIS — K224 Dyskinesia of esophagus: Secondary | ICD-10-CM | POA: Diagnosis not present

## 2019-03-18 DIAGNOSIS — E785 Hyperlipidemia, unspecified: Secondary | ICD-10-CM | POA: Diagnosis not present

## 2019-03-18 DIAGNOSIS — N179 Acute kidney failure, unspecified: Secondary | ICD-10-CM | POA: Diagnosis not present

## 2019-03-18 DIAGNOSIS — R509 Fever, unspecified: Secondary | ICD-10-CM | POA: Diagnosis not present

## 2019-03-18 DIAGNOSIS — G301 Alzheimer's disease with late onset: Secondary | ICD-10-CM | POA: Diagnosis not present

## 2019-03-18 DIAGNOSIS — N289 Disorder of kidney and ureter, unspecified: Secondary | ICD-10-CM | POA: Diagnosis not present

## 2019-03-18 DIAGNOSIS — K219 Gastro-esophageal reflux disease without esophagitis: Secondary | ICD-10-CM | POA: Diagnosis not present

## 2019-03-18 DIAGNOSIS — D352 Benign neoplasm of pituitary gland: Secondary | ICD-10-CM

## 2019-03-18 DIAGNOSIS — J189 Pneumonia, unspecified organism: Secondary | ICD-10-CM | POA: Diagnosis not present

## 2019-03-18 DIAGNOSIS — E559 Vitamin D deficiency, unspecified: Secondary | ICD-10-CM | POA: Diagnosis not present

## 2019-03-18 DIAGNOSIS — Z20822 Contact with and (suspected) exposure to covid-19: Secondary | ICD-10-CM | POA: Diagnosis not present

## 2019-03-18 DIAGNOSIS — R531 Weakness: Secondary | ICD-10-CM | POA: Diagnosis not present

## 2019-03-18 DIAGNOSIS — J1282 Pneumonia due to coronavirus disease 2019: Secondary | ICD-10-CM | POA: Diagnosis not present

## 2019-03-18 DIAGNOSIS — I10 Essential (primary) hypertension: Secondary | ICD-10-CM | POA: Diagnosis not present

## 2019-03-18 DIAGNOSIS — E876 Hypokalemia: Secondary | ICD-10-CM | POA: Diagnosis not present

## 2019-03-18 DIAGNOSIS — U071 COVID-19: Secondary | ICD-10-CM | POA: Diagnosis not present

## 2019-03-28 DIAGNOSIS — R7982 Elevated C-reactive protein (CRP): Secondary | ICD-10-CM | POA: Diagnosis not present

## 2019-03-28 DIAGNOSIS — E876 Hypokalemia: Secondary | ICD-10-CM | POA: Diagnosis not present

## 2019-03-28 DIAGNOSIS — Z09 Encounter for follow-up examination after completed treatment for conditions other than malignant neoplasm: Secondary | ICD-10-CM | POA: Diagnosis not present

## 2019-03-28 DIAGNOSIS — J189 Pneumonia, unspecified organism: Secondary | ICD-10-CM | POA: Diagnosis not present

## 2019-04-19 ENCOUNTER — Ambulatory Visit: Payer: Medicare HMO | Attending: Internal Medicine

## 2019-04-19 DIAGNOSIS — Z23 Encounter for immunization: Secondary | ICD-10-CM | POA: Insufficient documentation

## 2019-04-19 NOTE — Progress Notes (Signed)
   Covid-19 Vaccination Clinic  Name:  VALESKA COURTADE    MRN: [id number] DOB: December 05, 1940  04/19/2019  Ms. Morsch was observed post Covid-19 immunization for 15 minutes without incidence. She was provided with Vaccine Information Sheet and instruction to access the V-Safe system.   Ms. Lusardi was instructed to call 911 with any severe reactions post vaccine: Marland Kitchen Difficulty breathing  . Swelling of your face and throat  . A fast heartbeat  . A bad rash all over your body  . Dizziness and weakness

## 2019-04-25 ENCOUNTER — Ambulatory Visit: Admission: RE | Admit: 2019-04-25 | Payer: Medicare HMO | Source: Ambulatory Visit

## 2019-05-10 ENCOUNTER — Other Ambulatory Visit: Payer: Self-pay

## 2019-05-10 ENCOUNTER — Ambulatory Visit
Admission: RE | Admit: 2019-05-10 | Discharge: 2019-05-10 | Disposition: A | Discharge status: Home / Self Care | Payer: Medicare HMO | Source: Ambulatory Visit | Attending: Neurosurgery | Admitting: Neurosurgery

## 2019-05-10 DIAGNOSIS — D352 Benign neoplasm of pituitary gland: Secondary | ICD-10-CM

## 2019-05-10 MED ORDER — GADOBUTROL 1 MMOL/ML IV SOLN
6.0000 mL | Freq: Once | INTRAVENOUS | Status: AC | PRN
  Administered 2019-05-10: 6 mL via INTRAVENOUS

## 2019-05-10 MED ORDER — GADOBUTROL 1 MMOL/ML IV SOLN: 4.0000 mL | Freq: Once | INTRAVENOUS | Status: DC | PRN

## 2019-05-14 DIAGNOSIS — D352 Benign neoplasm of pituitary gland: Secondary | ICD-10-CM | POA: Diagnosis not present

## 2019-05-17 DIAGNOSIS — F0280 Dementia in other diseases classified elsewhere without behavioral disturbance: Secondary | ICD-10-CM | POA: Diagnosis not present

## 2019-05-17 DIAGNOSIS — I10 Essential (primary) hypertension: Secondary | ICD-10-CM | POA: Diagnosis not present

## 2019-05-17 DIAGNOSIS — M1711 Unilateral primary osteoarthritis, right knee: Secondary | ICD-10-CM | POA: Diagnosis not present

## 2019-05-17 DIAGNOSIS — Z0000 Encounter for general adult medical examination without abnormal findings: Secondary | ICD-10-CM | POA: Diagnosis not present

## 2019-05-17 DIAGNOSIS — D352 Benign neoplasm of pituitary gland: Secondary | ICD-10-CM | POA: Diagnosis not present

## 2019-05-17 DIAGNOSIS — K5909 Other constipation: Secondary | ICD-10-CM | POA: Diagnosis not present

## 2019-05-17 DIAGNOSIS — E7849 Other hyperlipidemia: Secondary | ICD-10-CM | POA: Diagnosis not present

## 2019-05-17 DIAGNOSIS — G309 Alzheimer's disease, unspecified: Secondary | ICD-10-CM | POA: Diagnosis not present

## 2019-05-17 DIAGNOSIS — F329 Major depressive disorder, single episode, unspecified: Secondary | ICD-10-CM | POA: Diagnosis not present

## 2019-05-20 ENCOUNTER — Ambulatory Visit: Payer: Medicare HMO | Attending: Internal Medicine

## 2019-05-20 DIAGNOSIS — Z23 Encounter for immunization: Secondary | ICD-10-CM

## 2019-05-20 NOTE — Progress Notes (Signed)
   Covid-19 Vaccination Clinic  Name:  Christy Green    MRN: [id number] DOB: 14-Sep-1940  05/20/2019  Christy Green was observed post Covid-19 immunization for 15 minutes without incident. She was provided with Vaccine Information Sheet and instruction to access the V-Safe system.   Christy Green was instructed to call 911 with any severe reactions post vaccine: Marland Kitchen Difficulty breathing  . Swelling of face and throat  . A fast heartbeat  . A bad rash all over body  . Dizziness and weakness   Immunizations Administered    Name Date Dose VIS Date Route   Moderna COVID-19 Vaccine 05/20/2019 10:17 AM 0.5 mL 02/05/2019 Intramuscular   Manufacturer: Moderna   Lot: [id number]   [id number]

## 2019-06-10 DIAGNOSIS — H16223 Keratoconjunctivitis sicca, not specified as Sjogren's, bilateral: Secondary | ICD-10-CM | POA: Diagnosis not present

## 2019-06-17 DIAGNOSIS — H2000 Unspecified acute and subacute iridocyclitis: Secondary | ICD-10-CM | POA: Diagnosis not present

## 2019-07-01 DIAGNOSIS — H2000 Unspecified acute and subacute iridocyclitis: Secondary | ICD-10-CM | POA: Diagnosis not present

## 2019-07-16 DIAGNOSIS — H2000 Unspecified acute and subacute iridocyclitis: Secondary | ICD-10-CM | POA: Diagnosis not present

## 2019-08-13 DIAGNOSIS — H2000 Unspecified acute and subacute iridocyclitis: Secondary | ICD-10-CM | POA: Diagnosis not present

## 2019-09-10 DIAGNOSIS — H16223 Keratoconjunctivitis sicca, not specified as Sjogren's, bilateral: Secondary | ICD-10-CM | POA: Diagnosis not present

## 2019-09-17 ENCOUNTER — Other Ambulatory Visit: Payer: Self-pay | Admitting: Physician Assistant

## 2019-09-17 DIAGNOSIS — Z1231 Encounter for screening mammogram for malignant neoplasm of breast: Secondary | ICD-10-CM

## 2019-10-18 ENCOUNTER — Other Ambulatory Visit: Payer: Self-pay

## 2019-10-18 ENCOUNTER — Ambulatory Visit
Admission: RE | Admit: 2019-10-18 | Discharge: 2019-10-18 | Disposition: A | Discharge status: Home / Self Care | Payer: Medicare HMO | Source: Ambulatory Visit | Attending: Physician Assistant | Admitting: Physician Assistant

## 2019-10-18 DIAGNOSIS — Z1231 Encounter for screening mammogram for malignant neoplasm of breast: Secondary | ICD-10-CM | POA: Diagnosis not present

## 2019-11-14 DIAGNOSIS — I10 Essential (primary) hypertension: Secondary | ICD-10-CM | POA: Diagnosis not present

## 2019-11-14 DIAGNOSIS — D352 Benign neoplasm of pituitary gland: Secondary | ICD-10-CM | POA: Diagnosis not present

## 2019-11-14 DIAGNOSIS — R7309 Other abnormal glucose: Secondary | ICD-10-CM | POA: Diagnosis not present

## 2019-11-14 DIAGNOSIS — E7849 Other hyperlipidemia: Secondary | ICD-10-CM | POA: Diagnosis not present

## 2019-11-21 DIAGNOSIS — I10 Essential (primary) hypertension: Secondary | ICD-10-CM | POA: Diagnosis not present

## 2019-11-21 DIAGNOSIS — E7849 Other hyperlipidemia: Secondary | ICD-10-CM | POA: Diagnosis not present

## 2019-11-21 DIAGNOSIS — K5909 Other constipation: Secondary | ICD-10-CM | POA: Diagnosis not present

## 2019-11-21 DIAGNOSIS — D352 Benign neoplasm of pituitary gland: Secondary | ICD-10-CM | POA: Diagnosis not present

## 2019-11-21 DIAGNOSIS — Z0000 Encounter for general adult medical examination without abnormal findings: Secondary | ICD-10-CM | POA: Diagnosis not present

## 2019-11-21 DIAGNOSIS — F0280 Dementia in other diseases classified elsewhere without behavioral disturbance: Secondary | ICD-10-CM | POA: Diagnosis not present

## 2019-11-21 DIAGNOSIS — M1711 Unilateral primary osteoarthritis, right knee: Secondary | ICD-10-CM | POA: Diagnosis not present

## 2019-11-21 DIAGNOSIS — Z23 Encounter for immunization: Secondary | ICD-10-CM | POA: Diagnosis not present

## 2019-11-21 DIAGNOSIS — G301 Alzheimer's disease with late onset: Secondary | ICD-10-CM | POA: Diagnosis not present

## 2019-11-26 DIAGNOSIS — K5790 Diverticulosis of intestine, part unspecified, without perforation or abscess without bleeding: Secondary | ICD-10-CM | POA: Diagnosis not present

## 2019-11-26 DIAGNOSIS — K649 Unspecified hemorrhoids: Secondary | ICD-10-CM | POA: Diagnosis not present

## 2019-11-26 DIAGNOSIS — R7303 Prediabetes: Secondary | ICD-10-CM | POA: Diagnosis not present

## 2019-11-26 DIAGNOSIS — E559 Vitamin D deficiency, unspecified: Secondary | ICD-10-CM | POA: Diagnosis not present

## 2019-11-26 DIAGNOSIS — M1711 Unilateral primary osteoarthritis, right knee: Secondary | ICD-10-CM | POA: Diagnosis not present

## 2019-11-26 DIAGNOSIS — I10 Essential (primary) hypertension: Secondary | ICD-10-CM | POA: Diagnosis not present

## 2019-11-26 DIAGNOSIS — G301 Alzheimer's disease with late onset: Secondary | ICD-10-CM | POA: Diagnosis not present

## 2019-11-26 DIAGNOSIS — F0280 Dementia in other diseases classified elsewhere without behavioral disturbance: Secondary | ICD-10-CM | POA: Diagnosis not present

## 2019-11-26 DIAGNOSIS — E782 Mixed hyperlipidemia: Secondary | ICD-10-CM | POA: Diagnosis not present

## 2020-02-03 DIAGNOSIS — H16223 Keratoconjunctivitis sicca, not specified as Sjogren's, bilateral: Secondary | ICD-10-CM | POA: Diagnosis not present

## 2020-05-13 DIAGNOSIS — R7303 Prediabetes: Secondary | ICD-10-CM | POA: Diagnosis not present

## 2020-05-13 DIAGNOSIS — E7849 Other hyperlipidemia: Secondary | ICD-10-CM | POA: Diagnosis not present

## 2020-05-13 DIAGNOSIS — I10 Essential (primary) hypertension: Secondary | ICD-10-CM | POA: Diagnosis not present

## 2020-05-20 DIAGNOSIS — D352 Benign neoplasm of pituitary gland: Secondary | ICD-10-CM | POA: Diagnosis not present

## 2020-05-20 DIAGNOSIS — I10 Essential (primary) hypertension: Secondary | ICD-10-CM | POA: Diagnosis not present

## 2020-05-20 DIAGNOSIS — E782 Mixed hyperlipidemia: Secondary | ICD-10-CM | POA: Diagnosis not present

## 2020-05-20 DIAGNOSIS — F0280 Dementia in other diseases classified elsewhere without behavioral disturbance: Secondary | ICD-10-CM | POA: Diagnosis not present

## 2020-05-20 DIAGNOSIS — G301 Alzheimer's disease with late onset: Secondary | ICD-10-CM | POA: Diagnosis not present

## 2020-05-20 DIAGNOSIS — R0982 Postnasal drip: Secondary | ICD-10-CM | POA: Diagnosis not present

## 2020-05-20 DIAGNOSIS — Z0000 Encounter for general adult medical examination without abnormal findings: Secondary | ICD-10-CM | POA: Diagnosis not present

## 2020-05-20 DIAGNOSIS — R7303 Prediabetes: Secondary | ICD-10-CM | POA: Diagnosis not present

## 2020-05-20 DIAGNOSIS — Z23 Encounter for immunization: Secondary | ICD-10-CM | POA: Diagnosis not present

## 2020-06-04 DIAGNOSIS — K219 Gastro-esophageal reflux disease without esophagitis: Secondary | ICD-10-CM | POA: Diagnosis not present

## 2020-06-04 DIAGNOSIS — H903 Sensorineural hearing loss, bilateral: Secondary | ICD-10-CM | POA: Diagnosis not present

## 2020-06-04 DIAGNOSIS — H6123 Impacted cerumen, bilateral: Secondary | ICD-10-CM | POA: Diagnosis not present

## 2020-08-04 DIAGNOSIS — Z0100 Encounter for examination of eyes and vision without abnormal findings: Secondary | ICD-10-CM | POA: Diagnosis not present

## 2020-08-04 DIAGNOSIS — Z961 Presence of intraocular lens: Secondary | ICD-10-CM | POA: Diagnosis not present

## 2020-08-28 DIAGNOSIS — H903 Sensorineural hearing loss, bilateral: Secondary | ICD-10-CM | POA: Diagnosis not present

## 2020-09-04 DIAGNOSIS — H903 Sensorineural hearing loss, bilateral: Secondary | ICD-10-CM | POA: Diagnosis not present

## 2020-09-23 ENCOUNTER — Other Ambulatory Visit: Payer: Self-pay | Admitting: Physician Assistant

## 2020-09-28 ENCOUNTER — Other Ambulatory Visit: Payer: Self-pay | Admitting: Physician Assistant

## 2020-09-28 DIAGNOSIS — Z1231 Encounter for screening mammogram for malignant neoplasm of breast: Secondary | ICD-10-CM

## 2020-11-20 ENCOUNTER — Other Ambulatory Visit: Payer: Self-pay

## 2020-11-20 ENCOUNTER — Ambulatory Visit
Admission: RE | Admit: 2020-11-20 | Discharge: 2020-11-20 | Disposition: A | Discharge status: Home / Self Care | Payer: Medicare HMO | Source: Ambulatory Visit | Attending: Physician Assistant | Admitting: Physician Assistant

## 2020-11-20 DIAGNOSIS — Z1231 Encounter for screening mammogram for malignant neoplasm of breast: Secondary | ICD-10-CM | POA: Diagnosis not present

## 2020-12-18 DIAGNOSIS — R944 Abnormal results of kidney function studies: Secondary | ICD-10-CM | POA: Diagnosis not present

## 2020-12-18 DIAGNOSIS — E7849 Other hyperlipidemia: Secondary | ICD-10-CM | POA: Diagnosis not present

## 2020-12-18 DIAGNOSIS — R300 Dysuria: Secondary | ICD-10-CM | POA: Diagnosis not present

## 2020-12-18 DIAGNOSIS — E782 Mixed hyperlipidemia: Secondary | ICD-10-CM | POA: Diagnosis not present

## 2020-12-18 DIAGNOSIS — G301 Alzheimer's disease with late onset: Secondary | ICD-10-CM | POA: Diagnosis not present

## 2020-12-18 DIAGNOSIS — Z0000 Encounter for general adult medical examination without abnormal findings: Secondary | ICD-10-CM | POA: Diagnosis not present

## 2020-12-18 DIAGNOSIS — Z23 Encounter for immunization: Secondary | ICD-10-CM | POA: Diagnosis not present

## 2020-12-18 DIAGNOSIS — R7303 Prediabetes: Secondary | ICD-10-CM | POA: Diagnosis not present

## 2020-12-18 DIAGNOSIS — I10 Essential (primary) hypertension: Secondary | ICD-10-CM | POA: Diagnosis not present

## 2020-12-18 DIAGNOSIS — D352 Benign neoplasm of pituitary gland: Secondary | ICD-10-CM | POA: Diagnosis not present

## 2021-01-01 DIAGNOSIS — R944 Abnormal results of kidney function studies: Secondary | ICD-10-CM | POA: Diagnosis not present

## 2021-03-25 DIAGNOSIS — R413 Other amnesia: Secondary | ICD-10-CM | POA: Diagnosis not present

## 2021-03-25 DIAGNOSIS — E538 Deficiency of other specified B group vitamins: Secondary | ICD-10-CM | POA: Diagnosis not present

## 2021-03-25 DIAGNOSIS — D352 Benign neoplasm of pituitary gland: Secondary | ICD-10-CM | POA: Diagnosis not present

## 2021-03-25 DIAGNOSIS — R42 Dizziness and giddiness: Secondary | ICD-10-CM | POA: Diagnosis not present

## 2021-03-30 ENCOUNTER — Other Ambulatory Visit: Payer: Self-pay | Admitting: Physician Assistant

## 2021-03-30 DIAGNOSIS — R42 Dizziness and giddiness: Secondary | ICD-10-CM

## 2021-03-30 DIAGNOSIS — R413 Other amnesia: Secondary | ICD-10-CM

## 2021-03-30 DIAGNOSIS — D352 Benign neoplasm of pituitary gland: Secondary | ICD-10-CM

## 2021-04-16 ENCOUNTER — Ambulatory Visit
Admission: RE | Admit: 2021-04-16 | Discharge: 2021-04-16 | Disposition: A | Discharge status: Home / Self Care | Payer: Medicare HMO | Source: Ambulatory Visit | Attending: Physician Assistant | Admitting: Physician Assistant

## 2021-04-16 ENCOUNTER — Other Ambulatory Visit: Payer: Self-pay

## 2021-04-16 DIAGNOSIS — R42 Dizziness and giddiness: Secondary | ICD-10-CM | POA: Diagnosis not present

## 2021-04-16 DIAGNOSIS — R413 Other amnesia: Secondary | ICD-10-CM | POA: Insufficient documentation

## 2021-04-16 DIAGNOSIS — D352 Benign neoplasm of pituitary gland: Secondary | ICD-10-CM | POA: Insufficient documentation

## 2021-04-16 MED ORDER — GADOBUTROL 1 MMOL/ML IV SOLN
6.0000 mL | Freq: Once | INTRAVENOUS | Status: AC | PRN
  Administered 2021-04-16: 6 mL via INTRAVENOUS

## 2021-06-22 DIAGNOSIS — I10 Essential (primary) hypertension: Secondary | ICD-10-CM | POA: Diagnosis not present

## 2021-06-22 DIAGNOSIS — E782 Mixed hyperlipidemia: Secondary | ICD-10-CM | POA: Diagnosis not present

## 2021-06-22 DIAGNOSIS — R7303 Prediabetes: Secondary | ICD-10-CM | POA: Diagnosis not present

## 2021-06-22 DIAGNOSIS — Z0000 Encounter for general adult medical examination without abnormal findings: Secondary | ICD-10-CM | POA: Diagnosis not present

## 2021-06-22 DIAGNOSIS — F0280 Dementia in other diseases classified elsewhere without behavioral disturbance: Secondary | ICD-10-CM | POA: Diagnosis not present

## 2021-06-22 DIAGNOSIS — D352 Benign neoplasm of pituitary gland: Secondary | ICD-10-CM | POA: Diagnosis not present

## 2021-06-22 DIAGNOSIS — G301 Alzheimer's disease with late onset: Secondary | ICD-10-CM | POA: Diagnosis not present

## 2021-06-22 DIAGNOSIS — Z1231 Encounter for screening mammogram for malignant neoplasm of breast: Secondary | ICD-10-CM | POA: Diagnosis not present

## 2021-06-23 ENCOUNTER — Other Ambulatory Visit: Payer: Self-pay | Admitting: Physician Assistant

## 2021-06-23 DIAGNOSIS — Z1231 Encounter for screening mammogram for malignant neoplasm of breast: Secondary | ICD-10-CM

## 2021-08-24 DIAGNOSIS — Z111 Encounter for screening for respiratory tuberculosis: Secondary | ICD-10-CM | POA: Diagnosis not present

## 2021-10-11 DIAGNOSIS — L089 Local infection of the skin and subcutaneous tissue, unspecified: Secondary | ICD-10-CM | POA: Diagnosis not present

## 2021-10-11 DIAGNOSIS — S81802A Unspecified open wound, left lower leg, initial encounter: Secondary | ICD-10-CM | POA: Diagnosis not present

## 2021-10-11 DIAGNOSIS — Z23 Encounter for immunization: Secondary | ICD-10-CM | POA: Diagnosis not present

## 2021-11-02 DIAGNOSIS — R413 Other amnesia: Secondary | ICD-10-CM | POA: Diagnosis not present

## 2021-11-02 DIAGNOSIS — R4586 Emotional lability: Secondary | ICD-10-CM | POA: Diagnosis not present

## 2021-11-02 DIAGNOSIS — R42 Dizziness and giddiness: Secondary | ICD-10-CM | POA: Diagnosis not present

## 2021-11-02 DIAGNOSIS — D352 Benign neoplasm of pituitary gland: Secondary | ICD-10-CM | POA: Diagnosis not present

## 2021-11-23 DIAGNOSIS — I10 Essential (primary) hypertension: Secondary | ICD-10-CM | POA: Diagnosis not present

## 2021-11-23 DIAGNOSIS — M810 Age-related osteoporosis without current pathological fracture: Secondary | ICD-10-CM | POA: Diagnosis not present

## 2021-11-23 DIAGNOSIS — F0284 Dementia in other diseases classified elsewhere, unspecified severity, with anxiety: Secondary | ICD-10-CM | POA: Diagnosis not present

## 2021-11-23 DIAGNOSIS — D352 Benign neoplasm of pituitary gland: Secondary | ICD-10-CM | POA: Diagnosis not present

## 2021-11-23 DIAGNOSIS — E785 Hyperlipidemia, unspecified: Secondary | ICD-10-CM | POA: Diagnosis not present

## 2021-11-23 DIAGNOSIS — G309 Alzheimer's disease, unspecified: Secondary | ICD-10-CM | POA: Diagnosis not present

## 2021-11-23 DIAGNOSIS — F329 Major depressive disorder, single episode, unspecified: Secondary | ICD-10-CM | POA: Diagnosis not present

## 2021-12-14 ENCOUNTER — Ambulatory Visit
Admission: RE | Admit: 2021-12-14 | Discharge: 2021-12-14 | Disposition: A | Discharge status: Home / Self Care | Payer: Medicare HMO | Source: Ambulatory Visit | Attending: Physician Assistant | Admitting: Physician Assistant

## 2021-12-14 DIAGNOSIS — Z1231 Encounter for screening mammogram for malignant neoplasm of breast: Secondary | ICD-10-CM | POA: Diagnosis not present

## 2021-12-20 DIAGNOSIS — E782 Mixed hyperlipidemia: Secondary | ICD-10-CM | POA: Diagnosis not present

## 2021-12-20 DIAGNOSIS — R7303 Prediabetes: Secondary | ICD-10-CM | POA: Diagnosis not present

## 2021-12-20 DIAGNOSIS — I10 Essential (primary) hypertension: Secondary | ICD-10-CM | POA: Diagnosis not present

## 2021-12-20 DIAGNOSIS — G309 Alzheimer's disease, unspecified: Secondary | ICD-10-CM | POA: Diagnosis not present

## 2021-12-20 DIAGNOSIS — D352 Benign neoplasm of pituitary gland: Secondary | ICD-10-CM | POA: Diagnosis not present

## 2021-12-20 DIAGNOSIS — Z0000 Encounter for general adult medical examination without abnormal findings: Secondary | ICD-10-CM | POA: Diagnosis not present

## 2021-12-20 DIAGNOSIS — F0280 Dementia in other diseases classified elsewhere without behavioral disturbance: Secondary | ICD-10-CM | POA: Diagnosis not present

## 2022-01-17 DIAGNOSIS — R413 Other amnesia: Secondary | ICD-10-CM | POA: Diagnosis not present

## 2022-01-17 DIAGNOSIS — D352 Benign neoplasm of pituitary gland: Secondary | ICD-10-CM | POA: Diagnosis not present

## 2022-01-17 DIAGNOSIS — R4586 Emotional lability: Secondary | ICD-10-CM | POA: Diagnosis not present

## 2022-01-17 DIAGNOSIS — I10 Essential (primary) hypertension: Secondary | ICD-10-CM | POA: Diagnosis not present

## 2022-01-17 DIAGNOSIS — R42 Dizziness and giddiness: Secondary | ICD-10-CM | POA: Diagnosis not present

## 2022-01-17 DIAGNOSIS — E538 Deficiency of other specified B group vitamins: Secondary | ICD-10-CM | POA: Diagnosis not present

## 2022-04-06 DIAGNOSIS — H16223 Keratoconjunctivitis sicca, not specified as Sjogren's, bilateral: Secondary | ICD-10-CM | POA: Diagnosis not present

## 2022-04-06 DIAGNOSIS — H2000 Unspecified acute and subacute iridocyclitis: Secondary | ICD-10-CM | POA: Diagnosis not present

## 2022-04-25 DIAGNOSIS — H16223 Keratoconjunctivitis sicca, not specified as Sjogren's, bilateral: Secondary | ICD-10-CM | POA: Diagnosis not present

## 2022-06-22 DIAGNOSIS — R7303 Prediabetes: Secondary | ICD-10-CM | POA: Diagnosis not present

## 2022-06-22 DIAGNOSIS — F0280 Dementia in other diseases classified elsewhere without behavioral disturbance: Secondary | ICD-10-CM | POA: Diagnosis not present

## 2022-06-22 DIAGNOSIS — N1831 Chronic kidney disease, stage 3a: Secondary | ICD-10-CM | POA: Diagnosis not present

## 2022-06-22 DIAGNOSIS — E782 Mixed hyperlipidemia: Secondary | ICD-10-CM | POA: Diagnosis not present

## 2022-06-22 DIAGNOSIS — I10 Essential (primary) hypertension: Secondary | ICD-10-CM | POA: Diagnosis not present

## 2022-06-22 DIAGNOSIS — Z0000 Encounter for general adult medical examination without abnormal findings: Secondary | ICD-10-CM | POA: Diagnosis not present

## 2022-06-22 DIAGNOSIS — D352 Benign neoplasm of pituitary gland: Secondary | ICD-10-CM | POA: Diagnosis not present

## 2022-06-22 DIAGNOSIS — G309 Alzheimer's disease, unspecified: Secondary | ICD-10-CM | POA: Diagnosis not present

## 2022-08-02 DIAGNOSIS — H16223 Keratoconjunctivitis sicca, not specified as Sjogren's, bilateral: Secondary | ICD-10-CM | POA: Diagnosis not present

## 2022-08-24 DIAGNOSIS — M7062 Trochanteric bursitis, left hip: Secondary | ICD-10-CM | POA: Diagnosis not present

## 2022-08-28 ENCOUNTER — Emergency Department
Admission: EM | Admit: 2022-08-28 | Discharge: 2022-08-28 | Disposition: A | Discharge status: Home / Self Care | Payer: Medicare HMO | Attending: Emergency Medicine | Admitting: Emergency Medicine

## 2022-08-28 ENCOUNTER — Emergency Department: Payer: Medicare HMO

## 2022-08-28 ENCOUNTER — Encounter: Payer: Self-pay | Admitting: Emergency Medicine

## 2022-08-28 ENCOUNTER — Other Ambulatory Visit: Payer: Self-pay

## 2022-08-28 DIAGNOSIS — M79605 Pain in left leg: Secondary | ICD-10-CM | POA: Insufficient documentation

## 2022-08-28 DIAGNOSIS — M79662 Pain in left lower leg: Secondary | ICD-10-CM | POA: Diagnosis not present

## 2022-08-28 DIAGNOSIS — I10 Essential (primary) hypertension: Secondary | ICD-10-CM | POA: Insufficient documentation

## 2022-08-28 DIAGNOSIS — M25552 Pain in left hip: Secondary | ICD-10-CM | POA: Diagnosis not present

## 2022-08-28 DIAGNOSIS — M160 Bilateral primary osteoarthritis of hip: Secondary | ICD-10-CM | POA: Diagnosis not present

## 2022-08-28 MED ORDER — IBUPROFEN 800 MG PO TABS
800.0000 mg | ORAL_TABLET | Freq: Once | ORAL | Status: AC
  Administered 2022-08-28: 800 mg via ORAL
  Filled 2022-08-28: qty 1

## 2022-08-28 MED ORDER — LIDOCAINE 5 % EX PTCH
1.0000 | MEDICATED_PATCH | CUTANEOUS | Status: DC
  Filled 2022-08-28: qty 1

## 2022-08-28 MED ORDER — CYCLOBENZAPRINE HCL 5 MG PO TABS: 5.0000 mg | ORAL_TABLET | Freq: Three times a day (TID) | ORAL | 0 refills | Status: AC | PRN

## 2022-08-28 NOTE — ED Triage Notes (Signed)
Pt to ED for left leg/hip pain for one week. Recently seen by PCP and KC and dx with bursa to left hip. Appt with ortho, having trouble with pain control.  Denies falls

## 2022-08-28 NOTE — Discharge Instructions (Addendum)
Take Ibuprofen for pain.

## 2022-08-28 NOTE — ED Provider Notes (Signed)
[facility] Emergency Department Provider Note     Event Date/Time   First MD Initiated Contact with Patient 08/28/22 1107     (approximate)   History   Leg Pain   HPI  Christy Green is a 82 y.o. female with a past medical history of hypertension who presents to the ED for complaint of left lateral hip pain x 2 weeks.  Pain is described as intermittently dull over with radiation descending to knee. Endorse radiation into groin.  Pain is worse with walking and movement. Patient denies injury or trauma. Advil taken for pain with minimal relief.  No previous injuries to affected limb.  No surgical history.  Patient was seen at primary care and treated for trochanteric bursitis however pain has since progressed.  Patient has orthopedic appointment scheduled for tomorrow 06/24. Patient denies fever, chest pain, shortness of breath, leg swelling, back pain, and urinary symptoms.   Physical Exam   Triage Vital Signs: ED Triage Vitals [08/28/22 1045]  Enc Vitals Group     BP (!) 155/63     Pulse Rate 65     Resp 18     Temp 98.3 F (36.8 C)     Temp src      SpO2 98 %     Weight 125 lb (56.7 kg)     Height 5\' 2"  (1.575 m)     Head Circumference      Peak Flow      Pain Score 5     Pain Loc      Pain Edu?      Excl. in GC?     Most recent vital signs: Vitals:   08/28/22 1045  BP: (!) 155/63  Pulse: 65  Resp: 18  Temp: 98.3 F (36.8 C)  SpO2: 98%   General: Alert and oriented. INAD.  Skin:  Warm, dry and intact. No ecchymosis noted along lateral left hip.   Head:  NCAT.  Eyes:  PERRLA. EOMI. Conjunctivae clear. CV:  Good peripheral perfusion. RRR. No peripheral edema.  RESP:  Normal effort. LCTAB. No retractions.  ABD:  No distention. Soft, Non tender. No masses or organomegaly.   BACK:  Spinous process is midline without deformity or tenderness. No CVA tenderness. MSK:   Full ROM in all joints. No swelling, deformity or tenderness.   NEURO: Cranial nerves II-XII intact. No focal deficits. Sensation and motor function intact.  OTHER:  Left hip reveals no ecchymosis, rashes or lesions on inspection.  Tender to palpation over anterior vastus lateralis and tensor fascia lota. positive Homans sign on left calf.  Neurovascular status intact.  Capillary refill brisk.   ED Results / Procedures / Treatments   Labs (all labs ordered are listed, but only abnormal results are displayed) Labs Reviewed - No data to display  RADIOLOGY  I personally viewed and evaluated these images as part of my medical decision making, as well as reviewing the written report by the radiologist.  ED Provider Interpretation: Ultrasound performed to rule out DVT reveals no evidence of a blood clot within the left lower Extremity.  Left hip x-ray is unremarkable.  US Venous Img Lower Unilateral Left (DVT)  Result Date: 08/28/2022 CLINICAL DATA:  Left lower extremity pain.  Evaluate for DVT. EXAM: LEFT LOWER EXTREMITY VENOUS DOPPLER ULTRASOUND TECHNIQUE: Gray-scale sonography with graded compression, as well as color Doppler and duplex ultrasound were performed to evaluate the lower extremity deep venous systems from the level of  the common femoral vein and including the common femoral, femoral, profunda femoral, popliteal and calf veins including the posterior tibial, peroneal and gastrocnemius veins when visible. The superficial great saphenous vein was also interrogated. Spectral Doppler was utilized to evaluate flow at rest and with distal augmentation maneuvers in the common femoral, femoral and popliteal veins. COMPARISON:  None Available. FINDINGS: Contralateral Common Femoral Vein: Respiratory phasicity is normal and symmetric with the symptomatic side. No evidence of thrombus. Normal compressibility. Common Femoral Vein: No evidence of thrombus. Normal compressibility, respiratory phasicity and response to augmentation. Saphenofemoral Junction: No  evidence of thrombus. Normal compressibility and flow on color Doppler imaging. Profunda Femoral Vein: No evidence of thrombus. Normal compressibility and flow on color Doppler imaging. Femoral Vein: No evidence of thrombus. Normal compressibility, respiratory phasicity and response to augmentation. Popliteal Vein: No evidence of thrombus. Normal compressibility, respiratory phasicity and response to augmentation. Calf Veins: No evidence of thrombus. Normal compressibility and flow on color Doppler imaging. Superficial Great Saphenous Vein: No evidence of thrombus. Normal compressibility. Other Findings:  None. IMPRESSION: No evidence of DVT within the left lower extremity. Electronically Signed   By: Simonne Come M.D.   On: 08/28/2022 12:26   DG Hip Unilat W or Wo Pelvis 2-3 Views Left  Result Date: 08/28/2022 CLINICAL DATA:  Left hip pain EXAM: DG HIP (WITH OR WITHOUT PELVIS) 2-3V LEFT COMPARISON:  04/12/2016 FINDINGS: There is no evidence of hip fracture or dislocation. Minimal degenerative changes of the bilateral hips. No other focal bone abnormality. IMPRESSION: Minimal degenerative changes of the bilateral hips. No acute findings. Electronically Signed   By: Duanne Guess D.O.   On: 08/28/2022 12:00    PROCEDURES:  Critical Care performed: No  Procedures  MEDICATIONS ORDERED IN ED: Medications  ibuprofen (ADVIL) tablet 800 mg (800 mg Oral Given 08/28/22 1140)   IMPRESSION / MDM / ASSESSMENT AND PLAN / ED COURSE  I reviewed the triage vital signs and the nursing notes.                               82 y.o. female presents to the emergency department for evaluation and treatment of acute left hip pain. See HPI for further details.   Differential diagnosis includes, but is not limited to sciatica, fracture, dislocation, DVT, iliotibial band syndrome, bursitis.  Plan Unilateral left hip x-ray given moderate tenderness on palpation with radiation into groin area. Ultrasound venous  imaging of left lower extremity to rule out blood clot given positive Homans' sign on physical exam. Ibuprofen is given for pain.  A lidocaine patch will be applied to left lateral thigh.   Imaging reviewed and reassuring of any acute abnormalities. Patient has an orthopedic appointment scheduled for tomorrow.  I encouraged her to continue with this appointment for further evaluation.  Patient is in satisfactory and stable condition for discharge and outpatient follow up. Patient will be discharged home with prescriptions for Flexeril. Patient is to follow up with scheduled appointment with orthopedic as needed or otherwise directed. Patient is given ED precautions to return to the ED for any worsening or new symptoms. Patient verbalizes understanding. All questions and concerns were addressed during ED visit.    Patient's presentation is most consistent with acute complicated illness / injury requiring diagnostic workup.  FINAL CLINICAL IMPRESSION(S) / ED DIAGNOSES   Final diagnoses:  Left leg pain     Rx / DC Orders   ED  Discharge Orders          Ordered    cyclobenzaprine (FLEXERIL) 5 MG tablet  3 times daily PRN        08/28/22 1242             Note:  This document was prepared using Dragon voice recognition software and may include unintentional dictation errors.    Romeo Apple, Doyel Mulkern A, PA-C 08/28/22 1821    Georga Hacking, MD 08/28/22 [phone]

## 2022-08-28 NOTE — ED Notes (Signed)
See triage note Presents with pain to lateral left hip which radiates into left leg  Min relief with OTC lidocaine patches and Aleve

## 2022-08-29 ENCOUNTER — Other Ambulatory Visit: Payer: Self-pay

## 2022-08-29 ENCOUNTER — Emergency Department: Payer: Medicare HMO

## 2022-08-29 ENCOUNTER — Other Ambulatory Visit: Payer: Self-pay | Admitting: Orthopedic Surgery

## 2022-08-29 ENCOUNTER — Emergency Department
Admission: EM | Admit: 2022-08-29 | Discharge: 2022-08-29 | Disposition: A | Discharge status: Home / Self Care | Payer: Medicare HMO | Attending: Emergency Medicine | Admitting: Emergency Medicine

## 2022-08-29 DIAGNOSIS — W19XXXA Unspecified fall, initial encounter: Secondary | ICD-10-CM | POA: Diagnosis not present

## 2022-08-29 DIAGNOSIS — M25552 Pain in left hip: Secondary | ICD-10-CM | POA: Insufficient documentation

## 2022-08-29 DIAGNOSIS — M7062 Trochanteric bursitis, left hip: Secondary | ICD-10-CM

## 2022-08-29 DIAGNOSIS — M1612 Unilateral primary osteoarthritis, left hip: Secondary | ICD-10-CM | POA: Diagnosis not present

## 2022-08-29 NOTE — ED Triage Notes (Signed)
Pt's family denies pt fell at any point, in with left hip pain x 5 days; [facility] ortho sent pt over for imaging for suspected hip fracture. Pt sitting calmly in wheelchair. Visitor present.

## 2022-08-29 NOTE — Discharge Instructions (Addendum)
Mr. Christy Green said you can return to his office and he will come up with next steps with you.

## 2022-08-29 NOTE — ED Notes (Signed)
[place] Fus daughter of pt signed for pt.

## 2022-08-29 NOTE — ED Notes (Signed)
See triage notes. Patient noted to be sitting calmly in wheelchair in room. Patient's family stated she was seen here yesterday and with Ortho this morning.

## 2022-08-29 NOTE — ED Provider Notes (Signed)
   [facility] Provider Note    Event Date/Time   First MD Initiated Contact with Patient 08/29/22 1202     (approximate)   History   Hip Pain (LEFT)   HPI  Christy Green is a 82 y.o. female sent from orthopedics office for evaluation of possible hip fracture.  Patient had fall 5 days ago, was seen at orthopedics office today, had concerning x-ray sent to ED for CT scan     Physical Exam   Triage Vital Signs: ED Triage Vitals  Enc Vitals Group     BP 08/29/22 1130 (!) 163/61     Pulse Rate 08/29/22 1130 (!) 59     Resp 08/29/22 1130 17     Temp 08/29/22 1130 98.1 F (36.7 C)     Temp Source 08/29/22 1130 Oral     SpO2 08/29/22 1130 100 %     Weight 08/29/22 1135 56.7 kg (125 lb)     Height 08/29/22 1135 1.575 m (5\' 2" )     Head Circumference --      Peak Flow --      Pain Score 08/29/22 1134 10     Pain Loc --      Pain Edu? --      Excl. in GC? --     Most recent vital signs: Vitals:   08/29/22 1130  BP: (!) 163/61  Pulse: (!) 59  Resp: 17  Temp: 98.1 F (36.7 C)  SpO2: 100%     General: Awake, no distress.  CV:  Good peripheral perfusion.  Resp:  Normal effort.  Abd:  No distention.  Other:     ED Results / Procedures / Treatments   Labs (all labs ordered are listed, but only abnormal results are displayed) Labs Reviewed - No data to display   EKG     RADIOLOGY CT is negative for fracture    PROCEDURES:  Critical Care performed:   Procedures   MEDICATIONS ORDERED IN ED: Medications - No data to display   IMPRESSION / MDM / ASSESSMENT AND PLAN / ED COURSE  I reviewed the triage vital signs and the nursing notes. Patient's presentation is most consistent with acute complicated illness / injury requiring diagnostic workup.  CT scan is negative for fracture, discussed with Ortho PA who will arrange for outpatient MRI        FINAL CLINICAL IMPRESSION(S) / ED DIAGNOSES   Final diagnoses:  Left  hip pain     Rx / DC Orders   ED Discharge Orders     None        Note:  This document was prepared using Dragon voice recognition software and may include unintentional dictation errors.   Jene Every, MD 08/29/22 [phone]

## 2022-09-06 ENCOUNTER — Telehealth: Payer: Self-pay

## 2022-09-06 NOTE — Telephone Encounter (Signed)
Transition Care Management Unsuccessful Follow-up Telephone Call  Date of discharge and from where:  [place] 6/24  Attempts:  2nd Attempt  Reason for unsuccessful TCM follow-up call:  Left voice message   Lenard Forth [facility] Guide, [facility] [phone] [address] [place], [place], [place] [address] Phone: [phone] Email: [email or web address]

## 2022-09-07 ENCOUNTER — Other Ambulatory Visit: Payer: Self-pay | Admitting: Family Medicine

## 2022-09-07 ENCOUNTER — Other Ambulatory Visit: Payer: Self-pay | Admitting: *Deleted

## 2022-09-07 ENCOUNTER — Other Ambulatory Visit: Payer: Self-pay | Admitting: Orthopedic Surgery

## 2022-09-07 DIAGNOSIS — M25552 Pain in left hip: Secondary | ICD-10-CM

## 2022-09-07 DIAGNOSIS — M7062 Trochanteric bursitis, left hip: Secondary | ICD-10-CM

## 2022-09-12 ENCOUNTER — Ambulatory Visit
Admission: RE | Admit: 2022-09-12 | Discharge: 2022-09-12 | Disposition: A | Discharge status: Home / Self Care | Payer: Medicare (Managed Care) | Source: Ambulatory Visit | Attending: Orthopedic Surgery | Admitting: Orthopedic Surgery

## 2022-09-12 DIAGNOSIS — M25552 Pain in left hip: Secondary | ICD-10-CM | POA: Insufficient documentation

## 2022-09-12 DIAGNOSIS — M7062 Trochanteric bursitis, left hip: Secondary | ICD-10-CM | POA: Diagnosis present

## 2022-09-15 ENCOUNTER — Other Ambulatory Visit: Payer: Medicare HMO

## 2023-01-03 ENCOUNTER — Other Ambulatory Visit: Payer: Self-pay | Admitting: Family Medicine

## 2023-01-03 DIAGNOSIS — Z1231 Encounter for screening mammogram for malignant neoplasm of breast: Secondary | ICD-10-CM

## 2023-01-26 ENCOUNTER — Ambulatory Visit
Admission: RE | Admit: 2023-01-26 | Discharge: 2023-01-26 | Disposition: A | Discharge status: Home / Self Care | Payer: Medicare (Managed Care) | Source: Ambulatory Visit | Attending: Family Medicine | Admitting: Family Medicine

## 2023-01-26 DIAGNOSIS — Z1231 Encounter for screening mammogram for malignant neoplasm of breast: Secondary | ICD-10-CM | POA: Diagnosis present

## 2023-09-23 ENCOUNTER — Other Ambulatory Visit: Payer: Self-pay

## 2023-09-23 ENCOUNTER — Emergency Department
Admission: EM | Admit: 2023-09-23 | Discharge: 2023-09-24 | Disposition: A | Discharge status: Home / Self Care | Attending: Emergency Medicine | Admitting: Emergency Medicine

## 2023-09-23 ENCOUNTER — Encounter: Payer: Self-pay | Admitting: Emergency Medicine

## 2023-09-23 ENCOUNTER — Emergency Department

## 2023-09-23 DIAGNOSIS — R109 Unspecified abdominal pain: Secondary | ICD-10-CM | POA: Diagnosis present

## 2023-09-23 DIAGNOSIS — I10 Essential (primary) hypertension: Secondary | ICD-10-CM | POA: Diagnosis not present

## 2023-09-23 DIAGNOSIS — G309 Alzheimer's disease, unspecified: Secondary | ICD-10-CM | POA: Diagnosis not present

## 2023-09-23 DIAGNOSIS — K5900 Constipation, unspecified: Secondary | ICD-10-CM | POA: Insufficient documentation

## 2023-09-23 LAB — CBC WITH DIFFERENTIAL/PLATELET
Abs Immature Granulocytes: 0.02 K/uL (ref 0.00–0.07)
Basophils Absolute: 0 K/uL (ref 0.0–0.1)
Basophils Relative: 0 %
Eosinophils Absolute: 0.1 K/uL (ref 0.0–0.5)
Eosinophils Relative: 1 %
HCT: 31.8 % — ABNORMAL LOW (ref 36.0–46.0)
Hemoglobin: 10.8 g/dL — ABNORMAL LOW (ref 12.0–15.0)
Immature Granulocytes: 0 %
Lymphocytes Relative: 17 %
Lymphs Abs: 1.4 K/uL (ref 0.7–4.0)
MCH: 31.2 pg (ref 26.0–34.0)
MCHC: 34 g/dL (ref 30.0–36.0)
MCV: 91.9 fL (ref 80.0–100.0)
Monocytes Absolute: 0.7 K/uL (ref 0.1–1.0)
Monocytes Relative: 8 %
Neutro Abs: 6.1 K/uL (ref 1.7–7.7)
Neutrophils Relative %: 74 %
Platelets: 198 K/uL (ref 150–400)
RBC: 3.46 MIL/uL — ABNORMAL LOW (ref 3.87–5.11)
RDW: 13.1 % (ref 11.5–15.5)
WBC: 8.2 K/uL (ref 4.0–10.5)
nRBC: 0 % (ref 0.0–0.2)

## 2023-09-23 LAB — COMPREHENSIVE METABOLIC PANEL WITH GFR
ALT: 16 U/L (ref 0–44)
AST: 30 U/L (ref 15–41)
Albumin: 3.9 g/dL (ref 3.5–5.0)
Alkaline Phosphatase: 38 U/L (ref 38–126)
Anion gap: 9 (ref 5–15)
BUN: 16 mg/dL (ref 8–23)
CO2: 23 mmol/L (ref 22–32)
Calcium: 8.9 mg/dL (ref 8.9–10.3)
Chloride: 104 mmol/L (ref 98–111)
Creatinine, Ser: 1.01 mg/dL — ABNORMAL HIGH (ref 0.44–1.00)
GFR, Estimated: 55 mL/min — ABNORMAL LOW (ref >60)
Glucose, Bld: 110 mg/dL — ABNORMAL HIGH (ref 70–99)
Potassium: 4 mmol/L (ref 3.5–5.1)
Sodium: 136 mmol/L (ref 135–145)
Total Bilirubin: 0.6 mg/dL (ref 0.0–1.2)
Total Protein: 6.9 g/dL (ref 6.5–8.1)

## 2023-09-23 LAB — URINALYSIS, ROUTINE W REFLEX MICROSCOPIC
Bilirubin Urine: NEGATIVE
Glucose, UA: NEGATIVE mg/dL
Hgb urine dipstick: NEGATIVE
Ketones, ur: NEGATIVE mg/dL
Leukocytes,Ua: NEGATIVE
Nitrite: NEGATIVE
Protein, ur: NEGATIVE mg/dL
Specific Gravity, Urine: 1.009 (ref 1.005–1.030)
pH: 7 (ref 5.0–8.0)

## 2023-09-23 LAB — LIPASE, BLOOD: Lipase: 36 U/L (ref 11–51)

## 2023-09-23 MED ORDER — SMOG ENEMA
960.0000 mL | Freq: Once | RECTAL | Status: AC
  Administered 2023-09-23: 960 mL via RECTAL
  Filled 2023-09-23: qty 960

## 2023-09-23 MED ORDER — MORPHINE SULFATE (PF) 4 MG/ML IV SOLN
4.0000 mg | Freq: Once | INTRAVENOUS | Status: AC
  Administered 2023-09-23: 4 mg via INTRAVENOUS
  Filled 2023-09-23: qty 1

## 2023-09-23 MED ORDER — IOHEXOL 300 MG/ML  SOLN
100.0000 mL | Freq: Once | INTRAMUSCULAR | Status: AC | PRN
Start: 2023-09-23 — End: 2023-09-23
  Administered 2023-09-23: 100 mL via INTRAVENOUS

## 2023-09-23 MED ORDER — LACTATED RINGERS IV BOLUS
1000.0000 mL | Freq: Once | INTRAVENOUS | Status: AC
  Administered 2023-09-23: 1000 mL via INTRAVENOUS

## 2023-09-23 MED ORDER — ONDANSETRON HCL 4 MG/2ML IJ SOLN
4.0000 mg | Freq: Once | INTRAMUSCULAR | Status: AC
  Administered 2023-09-23: 4 mg via INTRAVENOUS
  Filled 2023-09-23: qty 2

## 2023-09-23 MED ORDER — POLYETHYLENE GLYCOL 3350 17 G PO PACK: 34.0000 g | PACK | Freq: Once | ORAL | Status: DC

## 2023-09-23 MED ORDER — POLYETHYLENE GLYCOL 3350 17 G PO PACK
34.0000 g | PACK | Freq: Once | ORAL | Status: AC
  Administered 2023-09-23: 34 g via ORAL
  Filled 2023-09-23: qty 2

## 2023-09-23 NOTE — ED Triage Notes (Signed)
 Arrived pov for constipation and hemorrhoids x3-4 days.  Per daughter we have been treating it, with no relief. She also started bleeding today, she is just really uncomfortable.  Pt a&o, appears uncomfortable in wheel chair leaning to one side. Respirations even and unlabored

## 2023-09-23 NOTE — ED Provider Notes (Signed)
 [facility] Provider Note    Event Date/Time   First MD Initiated Contact with Patient 09/23/23 2029     (approximate)   History   Constipation   HPI  Christy Green is a 83 y.o. female who presents to the ED for evaluation of Constipation   I review annual PCP visit from last year.  History of HTN, HLD, Alzheimer's dementia, chronic constipation and GERD.  Patient is brought to the ED by her 2 daughters for evaluation of abdominal pain, anorectal pain and constipation.  She has dementia and lives with one of her sons, daughters check in frequently and they are uncertain when her last bowel movement was.  They provided multiple doses of liquid Dulcolax over the past couple days, which often helps with her constipation, but despite this has not passed any stool to their knowledge.  Increasingly uncomfortable with reported hemorrhoid pain.  1 daughter tried to give an enema and she felt a hard ball blocking full insertion of the enema tip, and blood came back with some of the fluid  Physical Exam   Triage Vital Signs: ED Triage Vitals  Encounter Vitals Group     BP 09/23/23 1959 (!) 196/86     Girls Systolic BP Percentile --      Girls Diastolic BP Percentile --      Boys Systolic BP Percentile --      Boys Diastolic BP Percentile --      Pulse Rate 09/23/23 1959 82     Resp 09/23/23 1959 17     Temp 09/23/23 1959 98.1 F (36.7 C)     Temp Source 09/23/23 1959 Oral     SpO2 09/23/23 1959 100 %     Weight 09/23/23 1957 145 lb (65.8 kg)     Height 09/23/23 1957 5' 4 (1.626 m)     Head Circumference --      Peak Flow --      Pain Score --      Pain Loc --      Pain Education --      Exclude from Growth Chart --     Most recent vital signs: Vitals:   09/23/23 1959  BP: (!) 196/86  Pulse: 82  Resp: 17  Temp: 98.1 F (36.7 C)  SpO2: 100%    General: Awake, no distress.  CV:  Good peripheral perfusion.  Resp:  Normal effort.   Abd:  No distention.  Diffuse lower abdominal tenderness MSK:  No deformity noted.  Neuro:  No focal deficits appreciated. Other:     ED Results / Procedures / Treatments   Labs (all labs ordered are listed, but only abnormal results are displayed) Labs Reviewed  CBC WITH DIFFERENTIAL/PLATELET - Abnormal; Notable for the following components:      Result Value   RBC 3.46 (*)    Hemoglobin 10.8 (*)    HCT 31.8 (*)    All other components within normal limits  COMPREHENSIVE METABOLIC PANEL WITH GFR - Abnormal; Notable for the following components:   Glucose, Bld 110 (*)    Creatinine, Ser 1.01 (*)    GFR, Estimated 55 (*)    All other components within normal limits  LIPASE, BLOOD  URINALYSIS, ROUTINE W REFLEX MICROSCOPIC    EKG   RADIOLOGY CT abdomen/pelvis interpreted by me with large rectal stool ball without signs of SBO  Official radiology report(s): CT ABDOMEN PELVIS W CONTRAST Result Date: 09/23/2023 CLINICAL DATA:  lower  abd pain, constipation, hematochezia EXAM: CT ABDOMEN AND PELVIS WITH CONTRAST TECHNIQUE: Multidetector CT imaging of the abdomen and pelvis was performed using the standard protocol following bolus administration of intravenous contrast. RADIATION DOSE REDUCTION: This exam was performed according to the departmental dose-optimization program which includes automated exposure control, adjustment of the mA and/or kV according to patient size and/or use of iterative reconstruction technique. CONTRAST:  OMNIPAQUE  IOHEXOL  300 MG/ML  SOLN COMPARISON:  None Available. FINDINGS: Lower chest: Bilateral lower lobe atelectasis. Hepatobiliary: No focal liver abnormality. Calcified gallstone noted within the gallbladder lumen. No gallbladder wall thickening or pericholecystic fluid. No biliary dilatation. Pancreas: No focal lesion. Normal pancreatic contour. No surrounding inflammatory changes. No main pancreatic ductal dilatation. Spleen: Normal in size without  focal abnormality. Adrenals/Urinary Tract: No adrenal nodule bilaterally. Bilateral kidneys enhance symmetrically. Fluid density lesion of the kidneys likely represent simple renal cysts. Simple renal cysts, in the absence of clinically indicated signs/symptoms, require no independent follow-up. No hydronephrosis. No hydroureter. The urinary bladder is unremarkable. On delayed imaging, there is no urothelial wall thickening and there are no filling defects in the opacified portions of the bilateral collecting systems or ureters. Stomach/Bowel: Stomach is within normal limits. No evidence of bowel wall thickening or dilatation. Stool throughout the colon. Rectal stool ball measuring up to 6.4 cm. Mild presacral fat stranding. Colonic diverticulosis. Appendix appears normal. Vascular/Lymphatic: No abdominal aorta or iliac aneurysm. Severe atherosclerotic plaque of the aorta and its branches. No abdominal, pelvic, or inguinal lymphadenopathy. Reproductive: Status post hysterectomy. No adnexal masses. Other: No intraperitoneal free fluid. No intraperitoneal free gas. No organized fluid collection. Musculoskeletal: No abdominal wall hernia or abnormality. No suspicious lytic or blastic osseous lesions. No acute displaced fracture. Chronic appearing L1 superior endplate compression fracture. IMPRESSION: 1. Constipation and a 6.4 cm stool ball. Nonspecific presacral free fluid with no definite bowel thickening to suggest developed developing stercoral colitis. 2. Colonic diverticulosis with no acute diverticulitis. 3. Cholelithiasis with no CT evidence of acute cholecystitis. 4.  Aortic Atherosclerosis (ICD10-I70.0). Electronically Signed   By: Morgane  Naveau M.D.   On: 09/23/2023 22:41    PROCEDURES and INTERVENTIONS:  .Fecal disimpaction  Date/Time: 09/23/2023 10:48 PM  Performed by: Claudene Rover, MD Authorized by: Claudene Rover, MD  Consent: Verbal consent obtained Risks and benefits: risks, benefits and  alternatives were discussed Patient tolerance: patient tolerated the procedure well with no immediate complications Comments: Chaperoned by nursing staff, lubricated digital exam with large firm stool ball within the rectum.  I am able to extract much of this and this is well-tolerated.     Medications  polyethylene glycol (MIRALAX  / GLYCOLAX ) packet 34 g (has no administration in time range)  sorbitol , magnesium hydroxide, mineral oil, glycerin (SMOG) enema (has no administration in time range)  lactated ringers  bolus 1,000 mL (0 mLs Intravenous Stopped 09/23/23 2238)  ondansetron  (ZOFRAN ) injection 4 mg (4 mg Intravenous Given 09/23/23 2113)  morphine  (PF) 4 MG/ML injection 4 mg (4 mg Intravenous Given 09/23/23 2116)  iohexol  (OMNIPAQUE ) 300 MG/ML solution 100 mL (100 mLs Intravenous Contrast Given 09/23/23 2211)     IMPRESSION / MDM / ASSESSMENT AND PLAN / ED COURSE  I reviewed the triage vital signs and the nursing notes.  Differential diagnosis includes, but is not limited to, SBO, diverticulitis, stool impaction, urinary retention, UTI  {Patient presents with symptoms of an acute illness or injury that is potentially life-threatening.  Pleasantly demented older woman presents from home with constipation, anal pain and  abdominal pain on exam.  Due to her abdominal pain, we will assess labs and urinalysis.  Due to her significant tenderness we will obtain CT imaging of abdomen/pelvis.  CT imaging is reassuring.  Large stool ball but no clear complicating features.  Normal white count, metabolic panel and lipase.  Awaiting UA.  Will provide MiraLAX  and enema.  She is signed out to oncoming physician to follow-up on her UA and passage of stool.  I suspect she will be suitable for outpatient management with her family pending clinical progress  Clinical Course as of 09/23/23 2313  Sat Sep 23, 2023  2234 Manual disimpaction [DS]  2312 Daughters return to the bedside.  We discussed blood  work, CT scan, disimpaction, medications and plan of care. [DS]    Clinical Course User Index [DS] Claudene Rover, MD     FINAL CLINICAL IMPRESSION(S) / ED DIAGNOSES   Final diagnoses:  None     Rx / DC Orders   ED Discharge Orders     None        Note:  This document was prepared using Dragon voice recognition software and may include unintentional dictation errors.   Claudene Rover, MD 09/23/23 2250

## 2023-09-23 NOTE — ED Notes (Signed)
 Bed alarm activated , family no longer at bedside

## 2023-09-24 MED ORDER — POLYETHYLENE GLYCOL 3350 17 G PO PACK
34.0000 g | PACK | Freq: Once | ORAL | Status: DC
  Filled 2023-09-24: qty 2

## 2023-09-24 NOTE — Discharge Instructions (Addendum)
 Please pick up MiraLAX  at home and continue to take this with 2-3 capfuls in the morning.  You can also pick up over-the-counter enemas and use this at home as well.  Keep using the Dulcolax daily as well.

## 2023-09-24 NOTE — ED Notes (Signed)
 Pt assisted to [facility] for BM, unsteady gait noted

## 2023-09-24 NOTE — ED Provider Notes (Signed)
  Physical Exam  BP (!) 196/86   Pulse 82   Temp 98.1 F (36.7 C) (Oral)   Resp 17   Ht 5' 4 (1.626 m)   Wt 65.8 kg   SpO2 100%   BMI 24.89 kg/m   Physical Exam  Procedures  Procedures  ED Course / MDM   Clinical Course as of 09/24/23 0110  Sat Sep 23, 2023  2234 Manual disimpaction [DS]  2312 Daughters return to the bedside.  We discussed blood work, CT scan, disimpaction, medications and plan of care. [DS]    Clinical Course User Index [DS] Claudene Rover, MD   Medical Decision Making Amount and/or Complexity of Data Reviewed Labs: ordered. Radiology: ordered.  Risk OTC drugs. Prescription drug management.   Received patient in signout.  83 year old female presenting today for constipation.  Seen with initial provider with overall reassuring exam but patient has been on Dulcolax outpatient with no significant help.  Vital signs and exam overall stable.  Laboratory workup with CBC, CMP, lipase negative.  CT abdomen/pelvis was obtained and overall shows constipation but no other acute findings.  Patient initially disimpacted with first provider.  She was signed out pending results of urine study and making sure she can have another bowel movement.  UA with no signs of infection.  Patient has had very small bowel movements and having pain around her rectum causing her to frequently get up.  Daughter little concerned about her going home overnight given that she has new supervision and may want to keep getting up.  We have agreed to hold her here in the ER until the morning hours to let her keep having bowel movements and then daughter will take her home at that time.     Malvina Alm DASEN, MD 09/24/23 CATHRINE

## 2023-09-24 NOTE — ED Notes (Signed)
 Pt assisted to toilet, small BM noted , gown changed, and pt assisted back to bed, bed alarm in use, NAD

## 2023-09-24 NOTE — ED Notes (Signed)
 Patient waiting on ride before DC.

## 2023-12-22 ENCOUNTER — Encounter: Payer: Self-pay | Admitting: Family Medicine

## 2023-12-25 ENCOUNTER — Other Ambulatory Visit: Payer: Self-pay | Admitting: Family Medicine

## 2023-12-25 DIAGNOSIS — M810 Age-related osteoporosis without current pathological fracture: Secondary | ICD-10-CM

## 2023-12-25 DIAGNOSIS — Z0000 Encounter for general adult medical examination without abnormal findings: Secondary | ICD-10-CM

## 2024-01-22 ENCOUNTER — Other Ambulatory Visit

## 2024-02-21 ENCOUNTER — Inpatient Hospital Stay: Admission: RE | Admit: 2024-02-21 | Discharge: 2024-02-21 | Attending: Family Medicine

## 2024-02-21 DIAGNOSIS — Z0000 Encounter for general adult medical examination without abnormal findings: Secondary | ICD-10-CM | POA: Diagnosis present

## 2024-02-21 DIAGNOSIS — M810 Age-related osteoporosis without current pathological fracture: Secondary | ICD-10-CM | POA: Insufficient documentation
